# Patient Record
Sex: Female | Born: 1990 | Race: Black or African American | Hispanic: No | Smoking: Current every day smoker
Health system: Southern US, Community
[De-identification: ages and names within clinical notes are randomized; demographics above are authoritative.]

## PROBLEM LIST (undated history)

## (undated) DIAGNOSIS — J40 Bronchitis, not specified as acute or chronic: Secondary | ICD-10-CM

## (undated) HISTORY — PX: BREAST SURGERY: SHX581

---

## 2012-12-28 ENCOUNTER — Encounter (HOSPITAL_COMMUNITY): Payer: Self-pay | Admitting: *Deleted

## 2012-12-28 ENCOUNTER — Emergency Department (HOSPITAL_COMMUNITY)
Admission: EM | Admit: 2012-12-28 | Discharge: 2012-12-28 | Disposition: A | Discharge status: Home / Self Care | Payer: Self-pay | Attending: Emergency Medicine | Admitting: Emergency Medicine

## 2012-12-28 DIAGNOSIS — F17200 Nicotine dependence, unspecified, uncomplicated: Secondary | ICD-10-CM | POA: Insufficient documentation

## 2012-12-28 DIAGNOSIS — L0291 Cutaneous abscess, unspecified: Secondary | ICD-10-CM

## 2012-12-28 DIAGNOSIS — IMO0002 Reserved for concepts with insufficient information to code with codable children: Secondary | ICD-10-CM | POA: Insufficient documentation

## 2012-12-28 MED ORDER — SULFAMETHOXAZOLE-TRIMETHOPRIM 800-160 MG PO TABS: 2.0000 | ORAL_TABLET | Freq: Two times a day (BID) | ORAL | Status: DC

## 2012-12-28 MED ORDER — CEPHALEXIN 500 MG PO CAPS: 500.0000 mg | ORAL_CAPSULE | Freq: Four times a day (QID) | ORAL | Status: DC

## 2012-12-28 NOTE — ED Provider Notes (Signed)
  CSN: [id number]     Arrival date & time 12/28/12  1126 History     First MD Initiated Contact with Patient 12/28/12 1332     Chief Complaint  Patient presents with  . Abscess   (Consider location/radiation/quality/duration/timing/severity/associated sxs/prior Treatment) HPI Comments: Patient presents with a recurrent abscess of the left axilla.  Abscess has been present since yesterday and is gradually worsening.  She reports that she has had an abscess of this same area three different times in the past.  She states that she is supposed to see Surgery, but has not been able to due to lack of insurance.  She denies any history of DM.  Denies fever, chills, nausea, or vomiting.  She has not noticed any drainage from the area at this time.  She has not tried any treatment prior to arrival.  The history is provided by the patient.    History reviewed. No pertinent past medical history. History reviewed. No pertinent past surgical history. No family history on file. History  Substance Use Topics  . Smoking status: Current Every Day Smoker -- 0.25 packs/day    Types: Cigarettes  . Smokeless tobacco: Not on file  . Alcohol Use: No   OB History   Grav Para Term Preterm Abortions TAB SAB Ect Mult Living                 Review of Systems  Constitutional: Negative for fever and chills.  Skin:       abscess  All other systems reviewed and are negative.    Allergies  Review of patient's allergies indicates no known allergies.  Home Medications  No current outpatient prescriptions on file. BP 115/69  Temp(Src) 97.9 F (36.6 C) (Oral)  Resp 18  SpO2 100%  LMP 12/07/2012 Physical Exam  Nursing note and vitals reviewed. Constitutional: She appears well-developed and well-nourished.  HENT:  Head: Normocephalic and atraumatic.  Neck: Normal range of motion. Neck supple.  Cardiovascular: Normal rate, regular rhythm and normal heart sounds.   Pulmonary/Chest: Effort normal and  breath sounds normal.  Neurological: She is alert.  Skin: Skin is warm and dry.  2 cm abscess of the left axilla with surrounding induration.  No surrounding erythema or warmth.  Psychiatric: She has a normal mood and affect.    ED Course   Procedures (including critical care time)  Labs Reviewed - No data to display No results found. No diagnosis found.  INCISION AND DRAINAGE Performed by: Anne Shutter, Jaquell Seddon Consent: Verbal consent obtained. Risks and benefits: risks, benefits and alternatives were discussed Type: abscess  Body area: left axilla  Anesthesia: local infiltration  Incision was made with a scalpel.  Local anesthetic: lidocaine 2% with epinephrine  Anesthetic total: 3 ml  Complexity: complex Blunt dissection to break up loculations  Drainage: purulent  Drainage amount: very small  Patient tolerance: Patient tolerated the procedure well with no immediate complications.     MDM  Patient with recurrent abscess of left axilla.  Small amount of purulent fluid expressed with incision.   Abscess was not large enough to warrant packing or drain,  wound recheck in 2 days. Encouraged home warm compresses.  Skin surrounding abscess indurated.  Will d/c to home with prescriptions for antibiotic.   Pascal Lux [place], PA-C 12/28/12 1536

## 2012-12-28 NOTE — ED Notes (Signed)
Pt with hx of 3 abscesses to L armpit.  States abscess since last night.  No drainage noted at this time.

## 2012-12-28 NOTE — ED Notes (Signed)
Called and looked for pt in general waiting area to take her to fast track

## 2012-12-30 NOTE — ED Provider Notes (Signed)
Medical screening examination/treatment/procedure(s) were performed by non-physician practitioner and as supervising physician I was immediately available for consultation/collaboration.   Laray Anger, DO 12/30/12 2104

## 2013-06-27 ENCOUNTER — Encounter (HOSPITAL_COMMUNITY): Payer: Self-pay | Admitting: Emergency Medicine

## 2013-06-27 ENCOUNTER — Emergency Department (HOSPITAL_COMMUNITY)
Admission: EM | Admit: 2013-06-27 | Discharge: 2013-06-28 | Disposition: A | Discharge status: Home / Self Care | Payer: BC Managed Care – PPO | Attending: Emergency Medicine | Admitting: Emergency Medicine

## 2013-06-27 DIAGNOSIS — F17200 Nicotine dependence, unspecified, uncomplicated: Secondary | ICD-10-CM | POA: Insufficient documentation

## 2013-06-27 DIAGNOSIS — J111 Influenza due to unidentified influenza virus with other respiratory manifestations: Secondary | ICD-10-CM

## 2013-06-27 DIAGNOSIS — M549 Dorsalgia, unspecified: Secondary | ICD-10-CM | POA: Insufficient documentation

## 2013-06-27 MED ORDER — IBUPROFEN 800 MG PO TABS
800.0000 mg | ORAL_TABLET | Freq: Once | ORAL | Status: AC
  Administered 2013-06-27: 800 mg via ORAL
  Filled 2013-06-27: qty 1

## 2013-06-27 MED ORDER — OSELTAMIVIR PHOSPHATE 75 MG PO CAPS: 75.0000 mg | ORAL_CAPSULE | Freq: Two times a day (BID) | ORAL | Status: DC

## 2013-06-27 NOTE — Discharge Instructions (Signed)
You were seen and evaluated for your symptoms of fever, body aches, headache and congestion. At this time your providers feel you have symptoms of the flu. Drink plenty of fluids to stay hydrated. Take Tylenol and ibuprofen for fever and body aches. Followup with a primary care provider for continued evaluation and treatment.    Influenza, Adult Influenza ("the flu") is a viral infection of the respiratory tract. It occurs more often in winter months because people spend more time in close contact with one another. Influenza can make you feel very sick. Influenza easily spreads from person to person (contagious). CAUSES  Influenza is caused by a virus that infects the respiratory tract. You can catch the virus by breathing in droplets from an infected person's cough or sneeze. You can also catch the virus by touching something that was recently contaminated with the virus and then touching your mouth, nose, or eyes. SYMPTOMS  Symptoms typically last 4 to 10 days and may include:  Fever.  Chills.  Headache, body aches, and muscle aches.  Sore throat.  Chest discomfort and cough.  Poor appetite.  Weakness or feeling tired.  Dizziness.  Nausea or vomiting. DIAGNOSIS  Diagnosis of influenza is often made based on your history and a physical exam. A nose or throat swab test can be done to confirm the diagnosis. RISKS AND COMPLICATIONS You may be at risk for a more severe case of influenza if you smoke cigarettes, have diabetes, have chronic heart disease (such as heart failure) or lung disease (such as asthma), or if you have a weakened immune system. Elderly people and pregnant women are also at risk for more serious infections. The most common complication of influenza is a lung infection (pneumonia). Sometimes, this complication can require emergency medical care and may be life-threatening. PREVENTION  An annual influenza vaccination (flu shot) is the best way to avoid getting  influenza. An annual flu shot is now routinely recommended for all adults in the [place] TREATMENT  In mild cases, influenza goes away on its own. Treatment is directed at relieving symptoms. For more severe cases, your caregiver may prescribe antiviral medicines to shorten the sickness. Antibiotic medicines are not effective, because the infection is caused by a virus, not by bacteria. HOME CARE INSTRUCTIONS  Only take over-the-counter or prescription medicines for pain, discomfort, or fever as directed by your caregiver.  Use a cool mist humidifier to make breathing easier.  Get plenty of rest until your temperature returns to normal. This usually takes 3 to 4 days.  Drink enough fluids to keep your urine clear or pale yellow.  Cover your mouth and nose when coughing or sneezing, and wash your hands well to avoid spreading the virus.  Stay home from work or school until your fever has been gone for at least 1 full day. SEEK MEDICAL CARE IF:   You have chest pain or a deep cough that worsens or produces more mucus.  You have nausea, vomiting, or diarrhea. SEEK IMMEDIATE MEDICAL CARE IF:   You have difficulty breathing, shortness of breath, or your skin or nails turn bluish.  You have severe neck pain or stiffness.  You have a severe headache, facial pain, or earache.  You have a worsening or recurring fever.  You have nausea or vomiting that cannot be controlled. MAKE SURE YOU:  Understand these instructions.  Will watch your condition.  Will get help right away if you are not doing well or get worse. Document Released: 04/19/2000  Document Revised: 10/22/2011 Document Reviewed: 07/22/2011 [facility] Patient Information 2014 [place], [place].

## 2013-06-27 NOTE — ED Notes (Signed)
Pt states she is here for back pain and sore throat  Pt states it hurts to swallow  Pt states her sxs started today  Pt is crying in triage

## 2013-06-27 NOTE — ED Provider Notes (Signed)
CSN: [id number]     Arrival date & time 06/27/13  2243 History  This chart was scribed for non-physician practitioner [name], PA working with Lyanne Co, MD by Elveria Rising, ED Scribe. This patient was seen in room WTR8/WTR8 and the patient's care was started at 11:44 PM.   Chief Complaint  Patient presents with  . Back Pain  . Sore Throat      The history is provided by the patient. No language interpreter was used.   HPI Comments: Yvonne Allen is a 23 y.o. female who presents to the Emergency Department complaining of back pain, onset this morning. Patient reports associated neck pain, sore throat, tiredness, headache, rhinorrhea, chills, fever, generalized myalgia, and one episode of vomiting today.  Patient reports that she has been feeling sick, tired and sleeping all day. Denies any associated cough. Patient has not taken medication to manage symptoms. Patient denies having any urinary issues. No recent sick contacts. No recent travel. No other aggravating or alleviating factors. No other associated symptoms.     History reviewed. No pertinent past medical history. History reviewed. No pertinent past surgical history. History reviewed. No pertinent family history. History  Substance Use Topics  . Smoking status: Current Every Day Smoker -- 0.25 packs/day    Types: Cigarettes  . Smokeless tobacco: Not on file  . Alcohol Use: Yes     Comment: occ   OB History   Grav Para Term Preterm Abortions TAB SAB Ect Mult Living                 Review of Systems  Constitutional: Positive for fever and chills.  HENT: Positive for sore throat. Negative for congestion and rhinorrhea.   Respiratory: Negative for cough.   Gastrointestinal: Positive for vomiting. Negative for diarrhea.  Genitourinary: Negative for dysuria and difficulty urinating.  Musculoskeletal: Positive for back pain and myalgias.  All other systems reviewed and are negative.      Allergies  Review of  patient's allergies indicates no known allergies.  Home Medications   No current outpatient prescriptions on file. BP 152/110  Pulse 104  Temp(Src) 100.4 F (38 C) (Oral)  Resp 24  Ht 5\' 5"  (1.651 m)  Wt 180 lb (81.647 kg)  BMI 29.95 kg/m2  SpO2 99%  LMP 06/05/2013 Physical Exam  Nursing note and vitals reviewed. Constitutional: She is oriented to person, place, and time. She appears well-developed and well-nourished. No distress.  HENT:  Head: Normocephalic and atraumatic.  Right Ear: Tympanic membrane normal.  Left Ear: Tympanic membrane normal.  Mouth/Throat: Oropharynx is clear and moist.  Mild redness to pharynx, no swelling or pus.   Eyes: EOM are normal.  Neck: Neck supple. No tracheal deviation present.  Cardiovascular: Normal rate and regular rhythm.   Pulmonary/Chest: Effort normal. No respiratory distress. She has no wheezes. She has no rales.  Abdominal: There is no tenderness. There is no rebound and no guarding.  Musculoskeletal: Normal range of motion.  Lymphadenopathy:    She has no cervical adenopathy.  Neurological: She is alert and oriented to person, place, and time.  Skin: Skin is warm and dry.  Psychiatric: She has a normal mood and affect. Her behavior is normal.    ED Course  Procedures  DIAGNOSTIC STUDIES: Oxygen Saturation is 99% on room air, normal by my interpretation.    COORDINATION OF CARE: 11:44 PM- patient seen and evaluated. She appears well no acute distress. Does not appear severely ill or toxic.  Does have fever and symptoms consistent with influenza. Normal respirations and O2 sats during exam. Lungs are clear. No clinical concerns for pneumonia. Pt advised of plan for treatment and pt agrees.       MDM   Final diagnoses:  Influenza   I personally performed the services described in this documentation, which was scribed in my presence. The recorded information has been reviewed and is accurate.    Angus SellerPeter S Zackrey Dyar,  PA-C 06/28/13 0140

## 2013-06-28 NOTE — ED Provider Notes (Signed)
Medical screening examination/treatment/procedure(s) were performed by non-physician practitioner and as supervising physician I was immediately available for consultation/collaboration.  EKG Interpretation   None         Lyanne CoKevin M Deontre Allsup, MD 06/28/13 [phone]

## 2013-08-03 ENCOUNTER — Emergency Department (HOSPITAL_COMMUNITY)
Admission: EM | Admit: 2013-08-03 | Discharge: 2013-08-04 | Disposition: A | Discharge status: Home / Self Care | Payer: BC Managed Care – PPO | Attending: Emergency Medicine | Admitting: Emergency Medicine

## 2013-08-03 ENCOUNTER — Encounter (HOSPITAL_COMMUNITY): Payer: Self-pay | Admitting: Emergency Medicine

## 2013-08-03 DIAGNOSIS — W57XXXA Bitten or stung by nonvenomous insect and other nonvenomous arthropods, initial encounter: Principal | ICD-10-CM

## 2013-08-03 DIAGNOSIS — Y929 Unspecified place or not applicable: Secondary | ICD-10-CM | POA: Insufficient documentation

## 2013-08-03 DIAGNOSIS — F17200 Nicotine dependence, unspecified, uncomplicated: Secondary | ICD-10-CM | POA: Insufficient documentation

## 2013-08-03 DIAGNOSIS — S40861A Insect bite (nonvenomous) of right upper arm, initial encounter: Secondary | ICD-10-CM

## 2013-08-03 DIAGNOSIS — L089 Local infection of the skin and subcutaneous tissue, unspecified: Secondary | ICD-10-CM | POA: Insufficient documentation

## 2013-08-03 DIAGNOSIS — T148 Other injury of unspecified body region: Principal | ICD-10-CM

## 2013-08-03 DIAGNOSIS — Y9389 Activity, other specified: Secondary | ICD-10-CM | POA: Insufficient documentation

## 2013-08-03 MED ORDER — TRAMADOL HCL 50 MG PO TABS: 50.0000 mg | ORAL_TABLET | Freq: Four times a day (QID) | ORAL | Status: DC | PRN

## 2013-08-03 MED ORDER — IBUPROFEN 400 MG PO TABS
600.0000 mg | ORAL_TABLET | Freq: Once | ORAL | Status: AC
  Administered 2013-08-03: 600 mg via ORAL
  Filled 2013-08-03 (×2): qty 1

## 2013-08-03 MED ORDER — CLINDAMYCIN HCL 150 MG PO CAPS: 300.0000 mg | ORAL_CAPSULE | Freq: Three times a day (TID) | ORAL | Status: DC

## 2013-08-03 MED ORDER — CLINDAMYCIN HCL 150 MG PO CAPS
300.0000 mg | ORAL_CAPSULE | Freq: Once | ORAL | Status: AC
  Administered 2013-08-03: 300 mg via ORAL
  Filled 2013-08-03: qty 2

## 2013-08-03 NOTE — Discharge Instructions (Signed)
Apply warm compresses to affected area 3-4 times a day.  Be sure to take antibiotics as prescribed.  Take tramadol as needed for pain. Do not drive while taking as this can cause drowsiness. Follow up in 2-3 days for recheck by primary care provider or ER if needed.  Return sooner if worsening symptoms including fever, increased pain, swelling, or difficulty bending elbow.

## 2013-08-03 NOTE — ED Provider Notes (Signed)
CSN: [id number]     Arrival date & time 08/03/13  2214 History   First MD Initiated Contact with Patient 08/03/13 2259 This chart was scribed for non-physician practitioner Junius FinnerErin O'Malley, PA-C working with Enid SkeensJoshua M Zavitz, MD by Valera CastleSteven Perry, ED scribe. This patient was seen in room TR09C/TR09C and the patient's care was started at 11:40 PM.     Chief Complaint  Patient presents with  . Insect Bite   (Consider location/radiation/quality/duration/timing/severity/associated sxs/prior Treatment) The history is provided by the patient. No language interpreter was used.   HPI Comments: Yvonne Allen is a 23 y.o. female who presents to the Emergency Department complaining of an insect bite over her right forearm, onset earlier today, with associated constant, burning pain, redness, and swelling. She denies seeing the bug that bit her. She reports moving her right arm exacerbates her pain. She denies any drainage. She denies applying an ointment over her bite and denies taking any pain medication. She denies fever, and any other associated symptoms. She denies allergies to medications.   PCP - No PCP Per Patient  History reviewed. No pertinent past medical history. History reviewed. No pertinent past surgical history. No family history on file. History  Substance Use Topics  . Smoking status: Current Every Day Smoker -- 0.25 packs/day    Types: Cigarettes  . Smokeless tobacco: Not on file  . Alcohol Use: Yes     Comment: occ   OB History   Grav Para Term Preterm Abortions TAB SAB Ect Mult Living                 Review of Systems  Constitutional: Negative for fever.  Skin: Positive for color change (redness) and wound (insect bite to right forearm without drainage).   Allergies  Review of patient's allergies indicates no known allergies.  Home Medications   Current Outpatient Rx  Name  Route  Sig  Dispense  Refill  . clindamycin (CLEOCIN) 150 MG capsule   Oral   Take 2 capsules  (300 mg total) by mouth 3 (three) times daily. May dispense as 150mg  capsules   60 capsule   0   . traMADol (ULTRAM) 50 MG tablet   Oral   Take 1 tablet (50 mg total) by mouth every 6 (six) hours as needed.   15 tablet   0     BP 126/82  Pulse 62  Temp(Src) 98.5 F (36.9 C) (Oral)  Resp 14  Ht 5\' 5"  (1.651 m)  Wt 183 lb (83.008 kg)  BMI 30.45 kg/m2  SpO2 96%  LMP 07/06/2013  Physical Exam  Nursing note and vitals reviewed. Constitutional: She is oriented to person, place, and time. She appears well-developed and well-nourished.  HENT:  Head: Normocephalic and atraumatic.  Eyes: EOM are normal.  Neck: Normal range of motion.  Cardiovascular: Normal rate.   Pulmonary/Chest: Effort normal.  Musculoskeletal: Normal range of motion.  FROM right elbow.  Neurological: She is alert and oriented to person, place, and time.  Skin: Skin is warm and dry. There is erythema.     Right arm-anterior aspect, radial side, near anticubital region.  2x3cm area of erythema and warmth with 1cm centralized area of induration with 2 small puncture wounds. Tenderness to palpation and light touch. No discharge or bleeding.  No red streaking.  Psychiatric: She has a normal mood and affect. Her behavior is normal.   ED Course  Procedures (including critical care time)  DIAGNOSTIC STUDIES: Oxygen Saturation is 96% on  room air, normal by my interpretation.    COORDINATION OF CARE: 11:42 PM-Discussed treatment plan which includes antibiotic and pain medication with pt at bedside and pt agreed to plan.   Labs Review Labs Reviewed - No data to display Imaging Review No results found.   EKG Interpretation None     Medications  clindamycin (CLEOCIN) capsule 300 mg (not administered)  ibuprofen (ADVIL,MOTRIN) tablet 600 mg (not administered)   MDM   Final diagnoses:  Insect bite of arm, right, infected    Pt c/o gradually worsening insect bite, appears to be infected. FROM right  elbow. Not concerned for septic joint.  There is induration w/o fluctuance, no active drainage or bleeding. Pt appears well, non-toxic, afebrile. Do not believe further workup or imaging needed at this time. Will tx for cellulitis, Rx: clindamycin, first dose given in ED, tramadol as needed for pain. Advised to f/u in 2 days if not improving. Return precautions provided. Pt verbalized understanding and agreement with tx plan.   I personally performed the services described in this documentation, which was scribed in my presence. The recorded information has been reviewed and is accurate.   Junius Finner, PA-C 08/03/13 2356

## 2013-08-03 NOTE — ED Notes (Signed)
Pt. reports insect bite at right forearm with progressing reddness / swelling . No drainage or discharge .

## 2013-08-04 NOTE — ED Provider Notes (Signed)
Medical screening examination/treatment/procedure(s) were performed by non-physician practitioner and as supervising physician I was immediately available for consultation/collaboration.   EKG Interpretation None        Enid SkeensJoshua M Pinkie Manger, MD 08/04/13 [phone]

## 2013-10-14 ENCOUNTER — Emergency Department (INDEPENDENT_AMBULATORY_CARE_PROVIDER_SITE_OTHER)
Admission: EM | Admit: 2013-10-14 | Discharge: 2013-10-14 | Disposition: A | Discharge status: Home / Self Care | Payer: BC Managed Care – PPO | Source: Home / Self Care

## 2013-10-14 ENCOUNTER — Encounter (HOSPITAL_COMMUNITY): Payer: Self-pay | Admitting: Emergency Medicine

## 2013-10-14 DIAGNOSIS — J020 Streptococcal pharyngitis: Secondary | ICD-10-CM

## 2013-10-14 LAB — POCT RAPID STREP A: STREPTOCOCCUS, GROUP A SCREEN (DIRECT): POSITIVE — AB

## 2013-10-14 MED ORDER — METHYLPREDNISOLONE SODIUM SUCC 125 MG IJ SOLR
125.0000 mg | Freq: Once | INTRAMUSCULAR | Status: AC
  Administered 2013-10-14: 125 mg via INTRAMUSCULAR

## 2013-10-14 MED ORDER — AMOXICILLIN 500 MG PO TABS: 500.0000 mg | ORAL_TABLET | Freq: Two times a day (BID) | ORAL | Status: DC

## 2013-10-14 MED ORDER — METHYLPREDNISOLONE SODIUM SUCC 125 MG IJ SOLR
INTRAMUSCULAR | Status: AC
  Filled 2013-10-14: qty 2

## 2013-10-14 NOTE — ED Provider Notes (Signed)
CSN: [id number]     Arrival date & time 10/14/13  1623 History   None    Chief Complaint  Patient presents with  . Sore Throat   (Consider location/radiation/quality/duration/timing/severity/associated sxs/prior Treatment) HPI  Sore throat. Started last night. Getting worse. Unable to eat due to pain. theraflu w/o benefit. Denies sick contacts. Hurts to swallow. Pain is described as a rock on throat. Non-radiating. Fever and chills, achy and general malaise. Deneis runny nose, cough, n/v/d, HA.   History reviewed. No pertinent past medical history. History reviewed. No pertinent past surgical history. History reviewed. No pertinent family history. History  Substance Use Topics  . Smoking status: Current Every Day Smoker -- 0.25 packs/day    Types: Cigarettes  . Smokeless tobacco: Not on file  . Alcohol Use: Yes     Comment: occ   OB History   Grav Para Term Preterm Abortions TAB SAB Ect Mult Living                 Review of Systems  Constitutional: Positive for fever, chills, activity change and appetite change.  HENT: Positive for sore throat. Negative for postnasal drip, rhinorrhea, sinus pressure and sneezing.   All other systems reviewed and are negative.   Allergies  Review of patient's allergies indicates no known allergies.  Home Medications   Prior to Admission medications   Medication Sig Start Date End Date Taking? Authorizing Provider  amoxicillin (AMOXIL) 500 MG tablet Take 1 tablet (500 mg total) by mouth 2 (two) times daily. 10/14/13   Ozella Rocks, MD  clindamycin (CLEOCIN) 150 MG capsule Take 2 capsules (300 mg total) by mouth 3 (three) times daily. May dispense as 150mg  capsules 08/03/13   Junius Finner, PA-C  traMADol (ULTRAM) 50 MG tablet Take 1 tablet (50 mg total) by mouth every 6 (six) hours as needed. 08/03/13   Junius Finner, PA-C   BP 122/68  Pulse 98  Temp(Src) 99.7 F (37.6 C) (Oral)  Resp 20  SpO2 100%  LMP 10/13/2013 Physical Exam   Constitutional: She appears well-developed and well-nourished. No distress.  HENT:  Tonsils 2+ bilat w/ copious exudate and injected  Neck: Normal range of motion. Neck supple.  Cardiovascular: Normal rate and normal heart sounds.  Exam reveals no gallop.   No murmur heard. Pulmonary/Chest: Effort normal and breath sounds normal. No stridor. No respiratory distress. She has no wheezes. She has no rales. She exhibits no tenderness.  Abdominal: Soft. She exhibits no distension.  Musculoskeletal: Normal range of motion. She exhibits no tenderness.  Lymphadenopathy:    She has cervical adenopathy.  Neurological: She is alert. She exhibits normal muscle tone.  Skin: No rash noted. She is not diaphoretic.  Psychiatric: She has a normal mood and affect. Her behavior is normal. Judgment and thought content normal.    ED Course  Procedures (including critical care time) Labs Review Labs Reviewed  POCT RAPID STREP A ([facility]RG CARE ONLY) - Abnormal; Notable for the following:    Streptococcus, Group A Screen (Direct) POSITIVE (*)    All other components within normal limits    Imaging Review No results found.   MDM   1. Strep pharyngitis    Strep + pharyngitis. Amox. Solumedrol 125 in office for immediate relief as pt w/ difficulty swallowing. Ibuprofen 600 prn pain and fever.  precuations given and all questions answered Shelly Flatten, MD Family Medicine PGY-3 10/14/2013, 5:34 PM      Ozella Rocks, MD 10/14/13  1734 

## 2013-10-14 NOTE — Discharge Instructions (Signed)
You are suffering from strep throat Start the amoxicillin to kill ithe infection and take this until it is gone The injection given you tonight was a steroid which will help with the swelling and pain Please start ibuprofen 600mg  every 6 hours as needed for pain  Pharyngitis Pharyngitis is a sore throat (pharynx). There is redness, pain, and swelling of your throat. HOME CARE   Drink enough fluids to keep your pee (urine) clear or pale yellow.  Only take medicine as told by your doctor.  You may get sick again if you do not take medicine as told. Finish your medicines, even if you start to feel better.  Do not take aspirin.  Rest.  Rinse your mouth (gargle) with salt water ( tsp of salt per 1 qt of water) every 1 2 hours. This will help the pain.  If you are not at risk for choking, you can suck on hard candy or sore throat lozenges. GET HELP IF:  You have large, tender lumps on your neck.  You have a rash.  You cough up green, yellow-brown, or bloody spit. GET HELP RIGHT AWAY IF:   You have a stiff neck.  You drool or cannot swallow liquids.  You throw up (vomit) or are not able to keep medicine or liquids down.  You have very bad pain that does not go away with medicine.  You have problems breathing (not from a stuffy nose). MAKE SURE YOU:   Understand these instructions.  Will watch your condition.  Will get help right away if you are not doing well or get worse. Document Released: 10/09/2007 Document Revised: 02/10/2013 Document Reviewed: 12/28/2012 [facility] Patient Information 2014 [place], [place].

## 2013-10-14 NOTE — ED Notes (Signed)
C/o sore throat which started last night States her right ear does hurt States it is hard to swallow theraflu was taking as tx

## 2013-10-15 NOTE — ED Provider Notes (Signed)
Medical screening examination/treatment/procedure(s) were performed by a resident physician and as supervising physician I was immediately available for consultation/collaboration.  Leslee Homeavid Khiem Gargis, M.D.  Reuben Likesavid C Canaan Holzer, MD 10/15/13 2156

## 2014-01-27 ENCOUNTER — Other Ambulatory Visit (HOSPITAL_COMMUNITY): Payer: Self-pay | Admitting: Obstetrics and Gynecology

## 2014-01-27 DIAGNOSIS — Z3141 Encounter for fertility testing: Secondary | ICD-10-CM

## 2014-02-07 ENCOUNTER — Encounter (HOSPITAL_COMMUNITY): Payer: Self-pay

## 2014-02-07 ENCOUNTER — Ambulatory Visit (HOSPITAL_COMMUNITY)
Admission: RE | Admit: 2014-02-07 | Discharge: 2014-02-07 | Disposition: A | Discharge status: Home / Self Care | Payer: BC Managed Care – PPO | Source: Ambulatory Visit | Attending: Obstetrics and Gynecology | Admitting: Obstetrics and Gynecology

## 2014-02-07 DIAGNOSIS — Z3141 Encounter for fertility testing: Secondary | ICD-10-CM | POA: Diagnosis not present

## 2014-02-07 MED ORDER — IOHEXOL 300 MG/ML  SOLN
20.0000 mL | Freq: Once | INTRAMUSCULAR | Status: AC | PRN
  Administered 2014-02-07: 20 mL

## 2015-05-17 ENCOUNTER — Encounter (HOSPITAL_COMMUNITY): Payer: Self-pay

## 2015-05-17 ENCOUNTER — Emergency Department (HOSPITAL_COMMUNITY)
Admission: EM | Admit: 2015-05-17 | Discharge: 2015-05-17 | Disposition: A | Discharge status: Home / Self Care | Payer: Self-pay | Attending: Emergency Medicine | Admitting: Emergency Medicine

## 2015-05-17 DIAGNOSIS — F17210 Nicotine dependence, cigarettes, uncomplicated: Secondary | ICD-10-CM | POA: Insufficient documentation

## 2015-05-17 DIAGNOSIS — N611 Abscess of the breast and nipple: Secondary | ICD-10-CM | POA: Insufficient documentation

## 2015-05-17 DIAGNOSIS — Z792 Long term (current) use of antibiotics: Secondary | ICD-10-CM | POA: Insufficient documentation

## 2015-05-17 MED ORDER — LIDOCAINE-EPINEPHRINE (PF) 2 %-1:200000 IJ SOLN
10.0000 mL | Freq: Once | INTRAMUSCULAR | Status: AC
  Administered 2015-05-17: 10 mL
  Filled 2015-05-17: qty 10

## 2015-05-17 MED ORDER — SULFAMETHOXAZOLE-TRIMETHOPRIM 800-160 MG PO TABS: 1.0000 | ORAL_TABLET | Freq: Two times a day (BID) | ORAL | Status: AC

## 2015-05-17 MED ORDER — CEPHALEXIN 500 MG PO CAPS: 500.0000 mg | ORAL_CAPSULE | Freq: Four times a day (QID) | ORAL | Status: DC

## 2015-05-17 MED ORDER — OXYCODONE-ACETAMINOPHEN 5-325 MG PO TABS
1.0000 | ORAL_TABLET | Freq: Once | ORAL | Status: AC
  Administered 2015-05-17: 1 via ORAL
  Filled 2015-05-17: qty 1

## 2015-05-17 MED ORDER — OXYCODONE-ACETAMINOPHEN 5-325 MG PO TABS: 1.0000 | ORAL_TABLET | Freq: Four times a day (QID) | ORAL | Status: DC | PRN

## 2015-05-17 NOTE — ED Provider Notes (Signed)
CSN: [id number]     Arrival date & time 05/17/15  0140 History  By signing my name below, I, Freida Busman, attest that this documentation has been prepared under the direction and in the presence of Shon Baton, MD . Electronically Signed: Freida Busman, Scribe. 05/17/2015. 3:57 AM.      Chief Complaint  Patient presents with  . Breast Problem   The history is provided by the patient. No language interpreter was used.     HPI Comments:  Yvonne Allen is a 25 y.o. female who presents to the Emergency Department complaining of 10/10 right breast pain x 2 days with associated nipple discharge. Pt had a nipple piercing placed several years ago. She removed the piercing "awhile" ago; states there is now a "knot" at the site of the piercing. She denies fever. She also denies h/o similar symptom. Pt has applied warm compress with little relief.  History reviewed. No pertinent past medical history. History reviewed. No pertinent past surgical history. No family history on file. Social History  Substance Use Topics  . Smoking status: Current Every Day Smoker -- 0.25 packs/day    Types: Cigarettes  . Smokeless tobacco: None  . Alcohol Use: Yes     Comment: occ   OB History    No data available     Review of Systems  Constitutional: Negative for fever.  Skin: Positive for wound.       Nipple discharge  All other systems reviewed and are negative.   Allergies  Review of patient's allergies indicates no known allergies.  Home Medications   Prior to Admission medications   Medication Sig Start Date End Date Taking? Authorizing Provider  amoxicillin (AMOXIL) 500 MG tablet Take 1 tablet (500 mg total) by mouth 2 (two) times daily. 10/14/13   Ozella Rocks, MD  cephALEXin (KEFLEX) 500 MG capsule Take 1 capsule (500 mg total) by mouth 4 (four) times daily. 05/17/15   Shon Baton, MD  clindamycin (CLEOCIN) 150 MG capsule Take 2 capsules (300 mg total) by mouth 3 (three) times  daily. May dispense as 150mg  capsules 08/03/13   Junius Finner, PA-C  oxyCODONE-acetaminophen (PERCOCET/ROXICET) 5-325 MG tablet Take 1 tablet by mouth every 6 (six) hours as needed for severe pain. 05/17/15   Shon Baton, MD  sulfamethoxazole-trimethoprim (BACTRIM DS,SEPTRA DS) 800-160 MG tablet Take 1 tablet by mouth 2 (two) times daily. 05/17/15 05/24/15  Shon Baton, MD  traMADol (ULTRAM) 50 MG tablet Take 1 tablet (50 mg total) by mouth every 6 (six) hours as needed. 08/03/13   Junius Finner, PA-C   BP 130/63 mmHg  Pulse 93  Temp(Src) 98.1 F (36.7 C) (Oral)  Resp 16  SpO2 95%  LMP 04/16/2015 Physical Exam  Constitutional: She is oriented to person, place, and time. She appears well-developed and well-nourished. No distress.  HENT:  Head: Normocephalic and atraumatic.  Cardiovascular: Normal rate and regular rhythm.   Pulmonary/Chest: Effort normal. No respiratory distress.    Abdominal: Soft. Bowel sounds are normal.  Neurological: She is alert and oriented to person, place, and time.  Skin: Skin is warm and dry.  Chaperone (scribe) was present for breast exam which was performed with no discomfort or complications.    Psychiatric: She has a normal mood and affect.  Nursing note and vitals reviewed.   ED Course  .Marland KitchenIncision and Drainage Date/Time: 05/17/2015 4:45 AM Performed by: Shon Baton Authorized by: Shon Baton Consent: Verbal consent obtained. Risks  and benefits: risks, benefits and alternatives were discussed Consent given by: patient Type: abscess Body area: trunk Location details: right breast Anesthesia: local infiltration Local anesthetic: lidocaine 2% with epinephrine Anesthetic total: 3 ml Needle gauge: 18 Incision type: single straight Complexity: simple Drainage: purulent Drainage amount: moderate     DIAGNOSTIC STUDIES:  Oxygen Saturation is 95% on RA, adequate by my interpretation.    COORDINATION OF CARE:  3:56 AM  Discussed treatment plan with pt at bedside and pt agreed to plan.  Labs Review Labs Reviewed - No data to display  Imaging Review No results found. I have personally reviewed and evaluated these images and lab results as part of my medical decision-making.   EKG Interpretation None      MDM   Final diagnoses:  Subareolar breast abscess   Patient presents with abscess of the right breast. Abscess appears to be subareolar. Spontaneous drainage noted.  Local anesthesia was used and a 18-gauge needle was used to drain some of the abscess with moderate relief. Patient continued to have spontaneous drainage from both nipple piercing sites that appear to be purulent. She is otherwise nontoxic. We will place on antibiotics and have her continue warm soaks and compresses at home. Follow-up with the breast center versus general surgery. Patient was given strict return precautions including fever, worsening pain, increasing redness or swelling of the site.  After history, exam, and medical workup I feel the patient has been appropriately medically screened and is safe for discharge home. Pertinent diagnoses were discussed with the patient. Patient was given return precautions.  I personally performed the services described in this documentation, which was scribed in my presence. The recorded information has been reviewed and is accurate.    Shon Batonourtney F Horton, MD 05/17/15 [phone]

## 2015-05-17 NOTE — ED Notes (Signed)
MD at bedside. 

## 2015-05-17 NOTE — ED Notes (Addendum)
Pt reports draining from her right nipple, yellow/cream in color. Denies order.    10/10 pain.    Pt states there a knot in her nipple now approx the size of a dime.

## 2015-05-17 NOTE — ED Notes (Addendum)
Pt reports she got the areola to her right breast pierced many years ago but states she has been "picking at it" and has noticed purulent drainage from a bump around it. Irritation started 2 days ago.

## 2015-05-17 NOTE — Discharge Instructions (Signed)
You were seen today today for an abscess of your right breast. You will be given pain medication and antibiotic. Continue warm compresses. If not improved, you need to follow-up with the general surgeon or breast clinic for further evaluation and management.  SEEK MEDICAL CARE IF:   Your symptoms do not improve with the treatment prescribed by your health care provider within 2 days. SEEK IMMEDIATE MEDICAL CARE IF:   Your pain and swelling are getting worse.  You have pain that is not controlled with medicine.  You have a red line extending from the breast toward your armpit.  You have a fever or persistent symptoms for more than 2-3 days.  You have a fever and your symptoms suddenly get worse.   This information is not intended to replace advice given to you by your health care provider. Make sure you discuss any questions you have with your health care provider.   Document Released: 04/22/2005 Document Revised: 04/27/2013 Document Reviewed: 11/20/2012 Elsevier Interactive Patient Education [employer]2016 Elsevier Inc.

## 2016-01-02 ENCOUNTER — Encounter (HOSPITAL_COMMUNITY): Payer: Self-pay

## 2016-01-02 ENCOUNTER — Emergency Department (HOSPITAL_COMMUNITY)
Admission: EM | Admit: 2016-01-02 | Discharge: 2016-01-02 | Disposition: A | Discharge status: Home / Self Care | Payer: Self-pay | Attending: Emergency Medicine | Admitting: Emergency Medicine

## 2016-01-02 DIAGNOSIS — F17210 Nicotine dependence, cigarettes, uncomplicated: Secondary | ICD-10-CM | POA: Insufficient documentation

## 2016-01-02 DIAGNOSIS — Z79899 Other long term (current) drug therapy: Secondary | ICD-10-CM | POA: Insufficient documentation

## 2016-01-02 DIAGNOSIS — N611 Abscess of the breast and nipple: Secondary | ICD-10-CM | POA: Insufficient documentation

## 2016-01-02 MED ORDER — SULFAMETHOXAZOLE-TRIMETHOPRIM 800-160 MG PO TABS: 1.0000 | ORAL_TABLET | Freq: Two times a day (BID) | ORAL | 0 refills | Status: AC

## 2016-01-02 MED ORDER — HYDROCODONE-ACETAMINOPHEN 5-325 MG PO TABS: 1.0000 | ORAL_TABLET | Freq: Four times a day (QID) | ORAL | 0 refills | Status: DC | PRN

## 2016-01-02 MED ORDER — SULFAMETHOXAZOLE-TRIMETHOPRIM 800-160 MG PO TABS
1.0000 | ORAL_TABLET | Freq: Once | ORAL | Status: AC
  Administered 2016-01-02: 1 via ORAL
  Filled 2016-01-02: qty 1

## 2016-01-02 MED ORDER — OXYCODONE-ACETAMINOPHEN 5-325 MG PO TABS
1.0000 | ORAL_TABLET | Freq: Once | ORAL | Status: AC
  Administered 2016-01-02: 1 via ORAL
  Filled 2016-01-02: qty 1

## 2016-01-02 NOTE — ED Triage Notes (Signed)
Pt complaining of irritation and redness to R nipple. Pt states some discharge x 3 days. Denies any fevers or shortness of breath.

## 2016-01-02 NOTE — Discharge Instructions (Signed)
Take ibuprofen for pain. Norco for severe pain. Continue warm compresses. Take Bactrim for infection. Go to breast center today for ultrasound and further treatment.

## 2016-01-02 NOTE — ED Provider Notes (Signed)
[facility]-EMERGENCY DEPT Provider Note   CSN: [id number] Arrival date & time: 01/02/16  0043     History   Chief Complaint Chief Complaint  Patient presents with  . Breast Discharge    HPI Yvonne Allen is a 25 y.o. female.  HPI Yvonne Allen is a 25 y.o. female presents to emergency department with recurrent right breast infection. States she noticed several days ago pain to the right breast, just beneath the nipple. She states since then increased pain and swelling. She reports similar symptoms a year ago and states that her abscess was drained in emergency department. She denies any fever or chills. No discharge. Has not taken any for pain prior to coming in. States palpation of the breast makes it worse, nothing makes it better.   History reviewed. No pertinent past medical history.  There are no active problems to display for this patient.   History reviewed. No pertinent surgical history.  OB History    No data available       Home Medications    Prior to Admission medications   Medication Sig Start Date End Date Taking? Authorizing Provider  amoxicillin (AMOXIL) 500 MG tablet Take 1 tablet (500 mg total) by mouth 2 (two) times daily. 10/14/13   Ozella Rocksavid J Merrell, MD  cephALEXin (KEFLEX) 500 MG capsule Take 1 capsule (500 mg total) by mouth 4 (four) times daily. 05/17/15   Shon Batonourtney F Horton, MD  clindamycin (CLEOCIN) 150 MG capsule Take 2 capsules (300 mg total) by mouth 3 (three) times daily. May dispense as 150mg  capsules 08/03/13   Junius FinnerErin O'Malley, PA-C  HYDROcodone-acetaminophen (NORCO) 5-325 MG tablet Take 1 tablet by mouth every 6 (six) hours as needed for moderate pain. 01/02/16   Osborn Pullin, PA-C  oxyCODONE-acetaminophen (PERCOCET/ROXICET) 5-325 MG tablet Take 1 tablet by mouth every 6 (six) hours as needed for severe pain. 05/17/15   Shon Batonourtney F Horton, MD  sulfamethoxazole-trimethoprim (BACTRIM DS,SEPTRA DS) 800-160 MG tablet Take 1 tablet by mouth 2 (two) times  daily. 01/02/16 01/09/16  Isidoro Santillana, PA-C  traMADol (ULTRAM) 50 MG tablet Take 1 tablet (50 mg total) by mouth every 6 (six) hours as needed. 08/03/13   Junius FinnerErin O'Malley, PA-C    Family History History reviewed. No pertinent family history.  Social History Social History  Substance Use Topics  . Smoking status: Current Every Day Smoker    Packs/day: 0.25    Types: Cigarettes  . Smokeless tobacco: Never Used  . Alcohol use Yes     Comment: occ     Allergies   Review of patient's allergies indicates no known allergies.   Review of Systems Review of Systems  Constitutional: Negative for chills and fever.  Respiratory: Negative for cough, chest tightness and shortness of breath.   Cardiovascular: Positive for chest pain. Negative for palpitations and leg swelling.  Gastrointestinal: Negative for abdominal pain, diarrhea, nausea and vomiting.  Genitourinary: Negative for dysuria, flank pain and pelvic pain.  Musculoskeletal: Negative for arthralgias, myalgias, neck pain and neck stiffness.  Skin: Negative for rash.  Neurological: Negative for dizziness, weakness and headaches.  All other systems reviewed and are negative.    Physical Exam Updated Vital Signs BP 123/79   Pulse 61   Temp 98.1 F (36.7 C) (Oral)   Resp 16   Ht 5\' 5"  (1.651 m)   Wt 99.5 kg   LMP 11/27/2015 (Approximate)   SpO2 96%   BMI 36.52 kg/m   Physical Exam  Constitutional: She appears well-developed  and well-nourished. No distress.  HENT:  Head: Normocephalic.  Eyes: Conjunctivae are normal.  Neck: Neck supple.  Cardiovascular: Normal rate, regular rhythm and normal heart sounds.   Pulmonary/Chest: Effort normal and breath sounds normal. No respiratory distress. She has no wheezes. She has no rales.  Swelling noted to the right areola. Palpable abscess-like structure just beneath the areola. Nipple is normal. Mild erythema surrounding area all. Tender to palpation.  Abdominal: Soft. Bowel  sounds are normal. She exhibits no distension. There is no tenderness. There is no rebound.  Musculoskeletal: She exhibits no edema.  Neurological: She is alert.  Skin: Skin is warm and dry.  Psychiatric: She has a normal mood and affect. Her behavior is normal.  Nursing note and vitals reviewed.    ED Treatments / Results  Labs (all labs ordered are listed, but only abnormal results are displayed) Labs Reviewed - No data to display  EKG  EKG Interpretation None       Radiology No results found.  Procedures Procedures (including critical care time)  Medications Ordered in ED Medications  oxyCODONE-acetaminophen (PERCOCET/ROXICET) 5-325 MG per tablet 1 tablet (not administered)  sulfamethoxazole-trimethoprim (BACTRIM DS,SEPTRA DS) 800-160 MG per tablet 1 tablet (not administered)     Initial Impression / Assessment and Plan / ED Course  I have reviewed the triage vital signs and the nursing notes.  Pertinent labs & imaging results that were available during my care of the patient were reviewed by me and considered in my medical decision making (see chart for details).  Clinical Course    Patient emergency department with recurrent sub-areolar abscess to the right breast. Abscess appears to be deep, Discussed with Dr. Wilkie Aye, not comfortable draining in emergency department. We'll need to get official ultrasound and have it incised and drained by a specialist. Will start of Bactrim, Norco for pain, warm compresses at home. All are deferred to breast center for ultrasound this morning. Patient agrees and voices understanding. She is neurovascular intact. Nontoxic appearing. Normal vital signs.  Vitals:   01/02/16 0058 01/02/16 0528  BP: 123/79 115/74  Pulse: 61 (!) 55  Resp: 16 18  Temp: 98.1 F (36.7 C) 97.8 F (36.6 C)  TempSrc: Oral Oral  SpO2: 96% 100%  Weight: 99.5 kg   Height: 5\' 5"  (1.651 m)      Final Clinical Impressions(s) / ED Diagnoses   Final  diagnoses:  Breast abscess    New Prescriptions New Prescriptions   HYDROCODONE-ACETAMINOPHEN (NORCO) 5-325 MG TABLET    Take 1 tablet by mouth every 6 (six) hours as needed for moderate pain.   SULFAMETHOXAZOLE-TRIMETHOPRIM (BACTRIM DS,SEPTRA DS) 800-160 MG TABLET    Take 1 tablet by mouth 2 (two) times daily.     Jaynie Crumble, PA-C 01/02/16 9147    Shon Baton, MD 01/02/16 [phone]

## 2016-01-04 ENCOUNTER — Other Ambulatory Visit (HOSPITAL_COMMUNITY): Payer: Self-pay | Admitting: *Deleted

## 2016-01-04 ENCOUNTER — Ambulatory Visit (HOSPITAL_COMMUNITY)
Admission: RE | Admit: 2016-01-04 | Discharge: 2016-01-04 | Disposition: A | Discharge status: Home / Self Care | Payer: Self-pay | Source: Ambulatory Visit | Attending: Obstetrics and Gynecology | Admitting: Obstetrics and Gynecology

## 2016-01-04 ENCOUNTER — Ambulatory Visit
Admission: RE | Admit: 2016-01-04 | Discharge: 2016-01-04 | Disposition: A | Discharge status: Home / Self Care | Payer: No Typology Code available for payment source | Source: Ambulatory Visit | Attending: Obstetrics and Gynecology | Admitting: Obstetrics and Gynecology

## 2016-01-04 ENCOUNTER — Ambulatory Visit: Admission: RE | Admit: 2016-01-04 | Payer: No Typology Code available for payment source | Source: Ambulatory Visit

## 2016-01-04 ENCOUNTER — Encounter (HOSPITAL_COMMUNITY): Payer: Self-pay

## 2016-01-04 ENCOUNTER — Ambulatory Visit
Admission: RE | Admit: 2016-01-04 | Discharge: 2016-01-04 | Disposition: A | Discharge status: Home / Self Care | Payer: Self-pay | Source: Ambulatory Visit | Attending: Obstetrics and Gynecology | Admitting: Obstetrics and Gynecology

## 2016-01-04 VITALS — BP 114/70 | Temp 99.1°F | Ht 65.0 in | Wt 219.0 lb

## 2016-01-04 DIAGNOSIS — N6310 Unspecified lump in the right breast, unspecified quadrant: Secondary | ICD-10-CM

## 2016-01-04 DIAGNOSIS — R2232 Localized swelling, mass and lump, left upper limb: Secondary | ICD-10-CM

## 2016-01-04 DIAGNOSIS — R2231 Localized swelling, mass and lump, right upper limb: Secondary | ICD-10-CM

## 2016-01-04 DIAGNOSIS — N611 Abscess of the breast and nipple: Secondary | ICD-10-CM

## 2016-01-04 DIAGNOSIS — Z1239 Encounter for other screening for malignant neoplasm of breast: Secondary | ICD-10-CM

## 2016-01-04 HISTORY — DX: Bronchitis, not specified as acute or chronic: J40

## 2016-01-04 NOTE — Progress Notes (Signed)
Complaints of left breast lump x 1 week that is red and warm to the touch. Patient states that the lump is constantly painful. Patient rates the pain at a 9 out of 10.  Pap Smear:  Pap smear not completed today. [facility] and normal per patient. Per patient has no history of an abnormal Pap smear. No Pap smear results are in EPIC.  Physical exam: Breasts Breasts symmetrical. Red around surrounding nipple. Bilateral axilla's have opened lumps that are draining a thick white discharge that is greater left axilla. No nipple retraction bilateral breasts. No nipple discharge bilateral breasts. No lymphadenopathy. Palpated a lump within the right breast at between 11-12 o'clock on the axilla. Palpated lumps bilateral axilla. Complaints of pain when palpated right breast lump. Patient complained of some tenderness when palpated bilateral axillary lumps on exam. [facility] and bilateral breast ultrasounds. Appointment scheduled for Thursday, January 04, 2016 at 1530.        Pelvic/Bimanual No Pap smear completed today since last Pap smear was in 2016 per patient. Pap smear not indicated per BCCCP guidelines.   Smoking History: Patient has never smoked.  Patient Navigation: Patient education provided. [facility]. Radiologist recommended a surgical consult. [facility] for a surgical consult of all three areas of concern. Appointment scheduled for Friday, January 05, 2016 at 1415. Gave patient appointment and told her to arrive at 1345 [facility] to [facility] needed [facility].

## 2016-01-04 NOTE — Patient Instructions (Addendum)
Explained breast self awareness to NIKEPaige Allen. Patient did not need a Pap smear today due to last Pap smear was in 2016 per patient. Let her know BCCCP will cover Pap smears every 3 years unless has a history of abnormal Pap smears. [facility] for adiagnostic mammogram and bilateral breast ultrasounds. Appointment scheduled for Thursday, January 04, 2016 at 1530. [facility] per recommendation for a surgical consult of all three areas of concern. Appointment scheduled for Friday, January 05, 2016 at 1415. Gave patient appointment and told her to arrive at 1345 [facility] needed [facility]. Patient aware of appointment and will be there. Yvonne Simonsaige Schow verbalized understanding.  Teah Votaw, Kathaleen Maserhristine Poll, RN 9:28 PM

## 2016-01-05 ENCOUNTER — Encounter (HOSPITAL_COMMUNITY): Payer: Self-pay | Admitting: *Deleted

## 2016-06-09 ENCOUNTER — Emergency Department (HOSPITAL_COMMUNITY): Payer: No Typology Code available for payment source

## 2016-06-09 ENCOUNTER — Encounter (HOSPITAL_COMMUNITY): Payer: Self-pay | Admitting: Oncology

## 2016-06-09 ENCOUNTER — Emergency Department (HOSPITAL_COMMUNITY)
Admission: EM | Admit: 2016-06-09 | Discharge: 2016-06-09 | Disposition: A | Discharge status: Home / Self Care | Payer: No Typology Code available for payment source | Attending: Emergency Medicine | Admitting: Emergency Medicine

## 2016-06-09 DIAGNOSIS — R002 Palpitations: Secondary | ICD-10-CM

## 2016-06-09 DIAGNOSIS — Z79899 Other long term (current) drug therapy: Secondary | ICD-10-CM | POA: Insufficient documentation

## 2016-06-09 DIAGNOSIS — F419 Anxiety disorder, unspecified: Secondary | ICD-10-CM | POA: Insufficient documentation

## 2016-06-09 DIAGNOSIS — F17210 Nicotine dependence, cigarettes, uncomplicated: Secondary | ICD-10-CM | POA: Insufficient documentation

## 2016-06-09 LAB — BASIC METABOLIC PANEL
Anion gap: 8 (ref 5–15)
BUN: 8 mg/dL (ref 6–20)
CALCIUM: 9.4 mg/dL (ref 8.9–10.3)
CO2: 25 mmol/L (ref 22–32)
CREATININE: 1.12 mg/dL — AB (ref 0.44–1.00)
Chloride: 103 mmol/L (ref 101–111)
GFR calc Af Amer: >60 mL/min (ref >60)
GLUCOSE: 108 mg/dL — AB (ref 65–99)
POTASSIUM: 3.4 mmol/L — AB (ref 3.5–5.1)
SODIUM: 136 mmol/L (ref 135–145)

## 2016-06-09 LAB — CBC
HCT: 39.6 % (ref 36.0–46.0)
Hemoglobin: 13.2 g/dL (ref 12.0–15.0)
MCH: 28.4 pg (ref 26.0–34.0)
MCHC: 33.3 g/dL (ref 30.0–36.0)
MCV: 85.2 fL (ref 78.0–100.0)
PLATELETS: 346 10*3/uL (ref 150–400)
RBC: 4.65 MIL/uL (ref 3.87–5.11)
RDW: 13.5 % (ref 11.5–15.5)
WBC: 7.6 10*3/uL (ref 4.0–10.5)

## 2016-06-09 LAB — I-STAT TROPONIN, ED
TROPONIN I, POC: 0 ng/mL (ref 0.00–0.08)
TROPONIN I, POC: 0 ng/mL (ref 0.00–0.08)

## 2016-06-09 MED ORDER — SODIUM CHLORIDE 0.9 % IV BOLUS (SEPSIS)
1000.0000 mL | Freq: Once | INTRAVENOUS | Status: AC
  Administered 2016-06-09: 1000 mL via INTRAVENOUS

## 2016-06-09 NOTE — ED Triage Notes (Addendum)
Pt states that she was sitting talking w/ a friend when she developed left sided CP.  Pt c/o shob, dry mouth, tingling in all extremities.  Pt reports never having a panic attack in the past.  Pt reports using cocaine this evening prior to CP.

## 2016-06-09 NOTE — ED Provider Notes (Signed)
[facility] pending delta troponin with plan to discharge home if negative. Results were negative and discussed with patient. She had some questions about what to do next to make sure this doesn't happen again. Provided patient with resources to establish care with a primary care provider and substance abuse counseling. Emphasized the importance of establishing primary care. Patient understood and agreed with discharge plan.    Georgiana ShoreJessica B Mitchell, PA-C 06/09/16 [id number]    Donnetta HutchingBrian Cook, MD 06/10/16 [phone]

## 2016-06-09 NOTE — ED Provider Notes (Signed)
[facility]-EMERGENCY DEPT Provider Note   CSN: [id number] Arrival date & time: 06/09/16  0343    History   Chief Complaint Chief Complaint  Patient presents with  . Anxiety    HPI Yvonne Allen is a 26 y.o. female.  26 year old female with a history of bronchitis presents to the emergency department for evaluation of shortness of breath. She states that symptoms began 10 minutes prior to arrival. Symptoms were acute in onset and have been persistent with associated palpitations. Patient denies any modifying factors of her symptoms. No medications taken prior to arrival. She does report associated dizziness. She states that she used marijuana and cocaine at 8 PM yesterday. She is a tobacco smoker as well. No associated fever, syncope, near syncope or lightheadedness, hemoptysis, nausea, vomiting. No recent surgeries or hospitalizations. No family history of sudden cardiac death. She denies having similar shortness of breath in the past.   The history is provided by the patient. No language interpreter was used.  Anxiety     Past Medical History:  Diagnosis Date  . Bronchitis     There are no active problems to display for this patient.   History reviewed. No pertinent surgical history.  OB History    Gravida Para Term Preterm AB Living   0 0 0 0 0 0   SAB TAB Ectopic Multiple Live Births   0 0 0 0 0       Home Medications    Prior to Admission medications   Medication Sig Start Date End Date Taking? Authorizing Provider  OVER THE COUNTER MEDICATION Take 1 tablet by mouth 2 (two) times daily. Burn HD- weight loss pill   Yes Historical Provider, MD  amoxicillin (AMOXIL) 500 MG tablet Take 1 tablet (500 mg total) by mouth 2 (two) times daily. Patient not taking: Reported on 01/04/2016 10/14/13   Ozella Rocksavid J Merrell, MD  cephALEXin (KEFLEX) 500 MG capsule Take 1 capsule (500 mg total) by mouth 4 (four) times daily. Patient not taking: Reported on 01/04/2016 05/17/15   Shon Batonourtney F  Horton, MD  clindamycin (CLEOCIN) 150 MG capsule Take 2 capsules (300 mg total) by mouth 3 (three) times daily. May dispense as 150mg  capsules Patient not taking: Reported on 01/04/2016 08/03/13   Junius FinnerErin O'Malley, PA-C  HYDROcodone-acetaminophen (NORCO) 5-325 MG tablet Take 1 tablet by mouth every 6 (six) hours as needed for moderate pain. Patient not taking: Reported on 06/09/2016 01/02/16   Jaynie Crumbleatyana Kirichenko, PA-C  oxyCODONE-acetaminophen (PERCOCET/ROXICET) 5-325 MG tablet Take 1 tablet by mouth every 6 (six) hours as needed for severe pain. Patient not taking: Reported on 01/04/2016 05/17/15   Shon Batonourtney F Horton, MD  traMADol (ULTRAM) 50 MG tablet Take 1 tablet (50 mg total) by mouth every 6 (six) hours as needed. Patient not taking: Reported on 01/04/2016 08/03/13   Junius FinnerErin O'Malley, PA-C    Family History No family history on file.  Social History Social History  Substance Use Topics  . Smoking status: Current Every Day Smoker    Packs/day: 0.25    Types: Cigarettes  . Smokeless tobacco: Never Used  . Alcohol use Yes     Comment: occ     Allergies   Patient has no known allergies.   Review of Systems Review of Systems Ten systems reviewed and are negative for acute change, except as noted in the HPI.    Physical Exam Updated Vital Signs BP 108/59 (BP Location: Left Arm)   Pulse (!) 58   Resp  18   LMP 06/03/2016 (Exact Date)   SpO2 100%   Physical Exam  Constitutional: She is oriented to person, place, and time. She appears well-developed and well-nourished. No distress.  Anxious, tearful  HENT:  Head: Normocephalic and atraumatic.  Eyes: Conjunctivae and EOM are normal. No scleral icterus.  Neck: Normal range of motion.  No JVD  Cardiovascular: Normal rate, regular rhythm and intact distal pulses.   Pulmonary/Chest: Effort normal. No respiratory distress. She has no wheezes. She has no rales.  Respirations even and unlabored. Lungs clear to auscultation bilaterally    Musculoskeletal: Normal range of motion.  Neurological: She is alert and oriented to person, place, and time. She exhibits normal muscle tone. Coordination normal.  GCS 15. Patient moving all extremities.  Skin: Skin is warm and dry. No rash noted. She is not diaphoretic. No erythema. No pallor.  Psychiatric: Her behavior is normal. Her mood appears anxious.  Nursing note and vitals reviewed.    ED Treatments / Results  Labs (all labs ordered are listed, but only abnormal results are displayed) Labs Reviewed  BASIC METABOLIC PANEL - Abnormal; Notable for the following:       Result Value   Potassium 3.4 (*)    Glucose, Bld 108 (*)    Creatinine, Ser 1.12 (*)    All other components within normal limits  CBC  I-STAT TROPOININ, ED    EKG  EKG Interpretation  Date/Time:  Sunday June 09 2016 03:53:57 EST Ventricular Rate:  96 PR Interval:    QRS Duration: 95 QT Interval:  375 QTC Calculation: 474 R Axis:   106 Text Interpretation:  Sinus rhythm Borderline right axis deviation borderline T waves No old tracing to compare Confirmed by GOLDSTON MD, SCOTT ([id number]) on 06/09/2016 6:39:16 AM       Radiology Dg Chest 2 View  Result Date: 06/09/2016 CLINICAL DATA:  Left-sided chest pain.  Cocaine use this evening. EXAM: CHEST  2 VIEW COMPARISON:  None. FINDINGS: Lung volumes are low. The cardiomediastinal contours are normal. The lungs are clear. Pulmonary vasculature is normal. No consolidation, pleural effusion, or pneumothorax. No acute osseous abnormalities are seen. IMPRESSION: Hypoventilatory chest without acute abnormality. Electronically Signed   By: Melanie  Ehinger M.D.   On: 06/09/2016 04:41    Procedures Procedures (including critical care time)  Medications Ordered in ED Medications  sodium chloride 0.9 % bolus 1,000 mL (1,000 mLs Intravenous New Bag/Given 06/09/16 0606)     Initial Impression / Assessment and Plan / ED Course  I have reviewed the triage vital  signs and the nursing notes.  Pertinent labs & imaging results that were available during my care of the patient were reviewed by me and considered in my medical decision making (see chart for details).     25  year old female presents to the emergency department for evaluation of palpitations and shortness of breath. She reports using marijuana and cocaine at 8PM yesterday. No family history of sudden cardiac death. No personal risk factors for ACS besides tobacco use. Initial cardiac workup is reassuring. No evidence of acute cardiopulmonary abnormality on chest x-ray.  Patient with delta troponin pending. Anticipate discharge if this is negative. Low suspicion for cardiac etiology. Heart score is 2 c/w low risk of ACS. Also doubt PE given lack of tachypnea, dyspnea, hypoxia, or tachycardia. Patient is PERC negative.   Patient asymptomatic on reassessment. VSS. She has no current complaints; denies pain. Patient signed out to Mathews Robinsons, PA-C at shift  change who will follow up on labs and discharge if pending troponin is negative.   Final Clinical Impressions(s) / ED Diagnoses   Final diagnoses:  Palpitations  Anxiety    New Prescriptions New Prescriptions   No medications on file     Antony Madura, PA-C 06/09/16 1610    Pricilla Loveless, MD 06/09/16 [phone]

## 2016-08-19 ENCOUNTER — Encounter (HOSPITAL_COMMUNITY): Payer: Self-pay

## 2016-08-19 ENCOUNTER — Emergency Department (HOSPITAL_COMMUNITY)
Admission: EM | Admit: 2016-08-19 | Discharge: 2016-08-19 | Disposition: A | Discharge status: Home / Self Care | Payer: No Typology Code available for payment source | Attending: Emergency Medicine | Admitting: Emergency Medicine

## 2016-08-19 DIAGNOSIS — L732 Hidradenitis suppurativa: Secondary | ICD-10-CM

## 2016-08-19 DIAGNOSIS — L02419 Cutaneous abscess of limb, unspecified: Secondary | ICD-10-CM

## 2016-08-19 DIAGNOSIS — L02412 Cutaneous abscess of left axilla: Secondary | ICD-10-CM | POA: Insufficient documentation

## 2016-08-19 DIAGNOSIS — F17210 Nicotine dependence, cigarettes, uncomplicated: Secondary | ICD-10-CM | POA: Insufficient documentation

## 2016-08-19 MED ORDER — LIDOCAINE HCL (PF) 1 % IJ SOLN
5.0000 mL | Freq: Once | INTRAMUSCULAR | Status: AC
  Administered 2016-08-19: 5 mL
  Filled 2016-08-19: qty 5

## 2016-08-19 MED ORDER — OXYCODONE-ACETAMINOPHEN 5-325 MG PO TABS
1.0000 | ORAL_TABLET | Freq: Once | ORAL | Status: AC
  Administered 2016-08-19: 1 via ORAL
  Filled 2016-08-19: qty 1

## 2016-08-19 MED ORDER — OXYCODONE-ACETAMINOPHEN 5-325 MG PO TABS: 1.0000 | ORAL_TABLET | Freq: Three times a day (TID) | ORAL | 0 refills | Status: AC | PRN

## 2016-08-19 NOTE — ED Notes (Signed)
ED Provider at bedside. 

## 2016-08-19 NOTE — ED Triage Notes (Signed)
Pt states she has a cyst under her left arm. She reports hx of same. Having to have it lanced. Pt denies diabetes.

## 2016-08-19 NOTE — ED Provider Notes (Signed)
[facility]-EMERGENCY DEPT Provider Note    By signing my name below, I, Earmon Phoenix, attest that this documentation has been prepared under the direction and in the presence of Felicie Morn, FNP. Electronically Signed: Earmon Phoenix, ED Scribe. 08/19/16. 9:51 PM.    History   Chief Complaint Chief Complaint  Patient presents with  . Cyst   The history is provided by the patient and medical records. No language interpreter was used.    Yvonne Allen is an obese 26 y.o. female with PMHx of hidradenitis suppurativa who presents to the Emergency Department complaining of an abscess to the left axilla that appeared about one week ago. She reports associated pain. She has not taken anything for pain. Touching the area and moving her LUE increases the pain. She denies alleviating factors. She denies fever, chills, nausea, vomiting. She reports having incision and drainage of abscesses in the past. She denies h/o DM.    Past Medical History:  Diagnosis Date  . Bronchitis     There are no active problems to display for this patient.   History reviewed. No pertinent surgical history.  OB History    Gravida Para Term Preterm AB Living   0 0 0 0 0 0   SAB TAB Ectopic Multiple Live Births   0 0 0 0 0       Home Medications    Prior to Admission medications   Medication Sig Start Date End Date Taking? Authorizing Provider  OVER THE COUNTER MEDICATION Take 1 tablet by mouth 2 (two) times daily. Burn HD- weight loss pill    Historical Provider, MD    Family History History reviewed. No pertinent family history.  Social History Social History  Substance Use Topics  . Smoking status: Current Every Day Smoker    Packs/day: 0.25    Types: Cigarettes  . Smokeless tobacco: Never Used  . Alcohol use Yes     Comment: occ     Allergies   Patient has no known allergies.   Review of Systems Review of Systems  Constitutional: Negative for chills and fever.  Gastrointestinal:  Negative for nausea and vomiting.  Skin: Positive for color change (abscess left axilla).  All other systems reviewed and are negative.    Physical Exam Updated Vital Signs BP (!) 137/92 (BP Location: Right Arm)   Pulse 60   Temp 98.4 F (36.9 C) (Oral)   Resp 16   LMP 07/19/2016 (Within Weeks)   SpO2 96%   Physical Exam  Constitutional: She is oriented to person, place, and time. She appears well-developed and well-nourished.  HENT:  Head: Normocephalic and atraumatic.  Eyes: Conjunctivae are normal.  Neck: Normal range of motion.  Cardiovascular: Normal rate.   Pulmonary/Chest: Effort normal.  Abdominal: Soft. She exhibits no distension.  Musculoskeletal: Normal range of motion.  Neurological: She is alert and oriented to person, place, and time.  Skin: Skin is warm and dry.  5 x 2 cm area of fluctuance with tenderness to palpation.  Psychiatric: She has a normal mood and affect. Her behavior is normal.  Nursing note and vitals reviewed.    ED Treatments / Results  DIAGNOSTIC STUDIES: Oxygen Saturation is 96% on RA, adequate by my interpretation.   COORDINATION OF CARE: 8:54 PM- Will have pt change into gown to better examine her. Pt verbalizes understanding and agrees to plan.  9:01 PM- Will incise and drain abscess.  Medications  lidocaine (PF) (XYLOCAINE) 1 % injection 5 mL (5 mLs  Infiltration Given by Other 08/19/16 2118)  oxyCODONE-acetaminophen (PERCOCET/ROXICET) 5-325 MG per tablet 1 tablet (1 tablet Oral Given 08/19/16 2126)    Labs (all labs ordered are listed, but only abnormal results are displayed) Labs Reviewed - No data to display  EKG  EKG Interpretation None       Radiology No results found.  Procedures .Marland KitchenIncision and Drainage Date/Time: 08/19/2016 9:31 PM Performed by: Katrinka Blazing, Aasir Daigler Authorized by: Katrinka Blazing, Gazella Anglin   Consent:    Consent obtained:  Verbal   Consent given by:  Patient Location:    Type:  Abscess   Size:  5 x 2 cm    Location:  Upper extremity   Upper extremity location:  Arm   Arm location: left axilla. Pre-procedure details:    Skin preparation:  Betadine Anesthesia (see MAR for exact dosages):    Anesthesia method:  Local infiltration   Local anesthetic:  Lidocaine 1% w/o epi Procedure type:    Complexity:  Simple Procedure details:    Needle aspiration: no     Incision types:  Stab incision   Incision depth:  Subcutaneous   Scalpel blade:  11   Wound management:  Probed and deloculated   Drainage:  Purulent   Drainage amount:  Moderate   Wound treatment:  Wound left open   Packing materials:  None Post-procedure details:    Patient tolerance of procedure:  Tolerated well, no immediate complications     (including critical care time)  Medications Ordered in ED Medications  lidocaine (PF) (XYLOCAINE) 1 % injection 5 mL (5 mLs Infiltration Given by Other 08/19/16 2118)  oxyCODONE-acetaminophen (PERCOCET/ROXICET) 5-325 MG per tablet 1 tablet (1 tablet Oral Given 08/19/16 2126)     Initial Impression / Assessment and Plan / ED Course  I have reviewed the triage vital signs and the nursing notes.  Pertinent labs & imaging results that were available during my care of the patient were reviewed by me and considered in my medical decision making (see chart for details).     Patient with skin abscess. Incision and drainage performed in the ED today.  Abscess was not large enough to warrant packing or drain placement. Wound recheck in 2 days. Supportive care and return precautions discussed.  The patient appears reasonably screened and/or stabilized for discharge and I doubt any other emergent medical condition requiring further screening, evaluation, or treatment in the ED prior to discharge.   Final Clinical Impressions(s) / ED Diagnoses   Final diagnoses:  Hidradenitis suppurativa of left axilla  Axillary abscess    New Prescriptions Discharge Medication List as of 08/19/2016 10:04 PM     START taking these medications   Details  oxyCODONE-acetaminophen (PERCOCET/ROXICET) 5-325 MG tablet Take 1 tablet by mouth every 8 (eight) hours as needed for severe pain., Starting Mon 08/19/2016, Print        I personally performed the services described in this documentation, which was scribed in my presence. The recorded information has been reviewed and is accurate.     Felicie Morn, NP 08/19/16 2130    Marily Memos, MD 08/20/16 8657

## 2017-06-30 ENCOUNTER — Other Ambulatory Visit: Payer: Self-pay | Admitting: Obstetrics and Gynecology

## 2017-06-30 DIAGNOSIS — N611 Abscess of the breast and nipple: Secondary | ICD-10-CM

## 2017-07-03 ENCOUNTER — Other Ambulatory Visit (HOSPITAL_COMMUNITY): Payer: Self-pay | Admitting: *Deleted

## 2017-07-03 ENCOUNTER — Ambulatory Visit
Admission: RE | Admit: 2017-07-03 | Discharge: 2017-07-03 | Disposition: A | Discharge status: Home / Self Care | Payer: No Typology Code available for payment source | Source: Ambulatory Visit | Attending: Obstetrics and Gynecology | Admitting: Obstetrics and Gynecology

## 2017-07-03 ENCOUNTER — Encounter (HOSPITAL_COMMUNITY): Payer: Self-pay

## 2017-07-03 ENCOUNTER — Other Ambulatory Visit: Payer: Self-pay | Admitting: Obstetrics and Gynecology

## 2017-07-03 ENCOUNTER — Ambulatory Visit (HOSPITAL_COMMUNITY)
Admission: RE | Admit: 2017-07-03 | Discharge: 2017-07-03 | Disposition: A | Discharge status: Home / Self Care | Payer: Self-pay | Source: Ambulatory Visit | Attending: Obstetrics and Gynecology | Admitting: Obstetrics and Gynecology

## 2017-07-03 VITALS — BP 120/66 | Ht 65.0 in | Wt 191.8 lb

## 2017-07-03 DIAGNOSIS — N611 Abscess of the breast and nipple: Secondary | ICD-10-CM

## 2017-07-03 DIAGNOSIS — Z1239 Encounter for other screening for malignant neoplasm of breast: Secondary | ICD-10-CM

## 2017-07-03 DIAGNOSIS — R2232 Localized swelling, mass and lump, left upper limb: Secondary | ICD-10-CM

## 2017-07-03 DIAGNOSIS — N6310 Unspecified lump in the right breast, unspecified quadrant: Secondary | ICD-10-CM

## 2017-07-03 NOTE — Patient Instructions (Addendum)
Explained breast self awareness to Yvonne Allen. Patient did not need a Pap smear today due to last Pap smear was 386-months ago per patient. Let her know BCCCP will cover Pap smears every 3 years unless has a history of abnormal Pap smears. [facility]. Appointment scheduled for Thursday, July 03, 2017 at 1110. Discussed smoking cessation with patient. Referred to the [facility] and gave resources to the free smoking cessation classes at [facility]. Yvonne Allen verbalized understanding.  Yvonne Allen, Yvonne Maserhristine Poll, RN 12:24 PM

## 2017-07-03 NOTE — Progress Notes (Signed)
Complaints of right breast lump x 2 weeks that has increased in size. Patient stated the lump is red and painful. Patient states the pain is constant. Patient rates the pain at a 10 out of 10.  Pap Smear: Pap smear not completed today. [facility] and normal per patient. Per patient has no history of an abnormal Pap smear. No Pap smear results are in EPIC.  Physical exam: Breasts Breasts symmetrical. Red area around surrounding the right nipple. Left axilla has opened lumps that are draining a thick white pus like discharge. No nipple retraction bilateral breasts. No nipple discharge bilateral breasts. No lymphadenopathy. Palpated a lump within the right breast at 12 o'clock on the areola. Palpated a lump within the left axilla. Complaints of pain when palpated both lumps. [facility] for bilateral breast ultras[facility]. Appointment scheduled for Thursday, July 03, 2017 at 1110.    Pelvic/Bimanual No Pap smear completed today since last Pap smear was 476-months ago per patient. Pap smear not indicated per BCCCP guidelines  Smoking History: Patient is a current smoker. Discussed smoking cessation with patient. Referred to the [facility] Q[facility] and gave resources to the free smoking cessation classes at [facility].  Patient Navigation: Patient education provided. Access to services provided for patient through BCCCP program.   Breast and Cervical Cancer Risk Assessment: Patient has no family history of breast cancer, known genetic mutations, or radiation treatment to the chest before age 28. Patient has no history of cervical dysplasia, immunocompromised, or DES exposure in-utero.

## 2017-07-04 ENCOUNTER — Encounter (HOSPITAL_COMMUNITY): Payer: Self-pay | Admitting: *Deleted

## 2018-09-03 ENCOUNTER — Other Ambulatory Visit: Payer: Self-pay

## 2018-09-03 ENCOUNTER — Encounter (HOSPITAL_COMMUNITY): Payer: Self-pay

## 2018-09-03 ENCOUNTER — Emergency Department (HOSPITAL_COMMUNITY)
Admission: EM | Admit: 2018-09-03 | Discharge: 2018-09-03 | Disposition: A | Discharge status: Home / Self Care | Payer: BLUE CROSS/BLUE SHIELD | Attending: Emergency Medicine | Admitting: Emergency Medicine

## 2018-09-03 DIAGNOSIS — L02412 Cutaneous abscess of left axilla: Secondary | ICD-10-CM | POA: Diagnosis not present

## 2018-09-03 DIAGNOSIS — F17210 Nicotine dependence, cigarettes, uncomplicated: Secondary | ICD-10-CM | POA: Diagnosis not present

## 2018-09-03 MED ORDER — DOXYCYCLINE HYCLATE 100 MG PO CAPS: 100.0000 mg | ORAL_CAPSULE | Freq: Two times a day (BID) | ORAL | 0 refills | Status: AC

## 2018-09-03 MED ORDER — LIDOCAINE-EPINEPHRINE (PF) 2 %-1:200000 IJ SOLN
10.0000 mL | Freq: Once | INTRAMUSCULAR | Status: AC
  Administered 2018-09-03: 10 mL
  Filled 2018-09-03: qty 20

## 2018-09-03 MED ORDER — HYDROCODONE-ACETAMINOPHEN 5-325 MG PO TABS
2.0000 | ORAL_TABLET | Freq: Once | ORAL | Status: AC
  Administered 2018-09-03: 2 via ORAL
  Filled 2018-09-03: qty 2

## 2018-09-03 NOTE — ED Provider Notes (Signed)
[facility] EMERGENCY DEPARTMENT Provider Note   CSN: [id number] Arrival date & time: 09/03/18  2217    History   Chief Complaint Chief Complaint  Patient presents with  . Cyst    HPI Yvonne Allen is a 28 y.o. female.     Patient presents to the emergency department with a chief complaint of left axillary abscess.  She states that she has a known cyst, which frequently becomes inflamed and infected.  She reports that she is scheduled to have surgery on it later this month at [facility].  She states that she has to have it drained repeatedly.  She reports worsening pain and swelling over the past week, but denies any discharge.  She denies any fevers or chills.  She states the pain is severe.  She denies any other associated symptoms.  The history is provided by the patient. No language interpreter was used.    Past Medical History:  Diagnosis Date  . Bronchitis     There are no active problems to display for this patient.   History reviewed. No pertinent surgical history.   OB History    Gravida  0   Para  0   Term  0   Preterm  0   AB  0   Living  0     SAB  0   TAB  0   Ectopic  0   Multiple  0   Live Births  0            Home Medications    Prior to Admission medications   Medication Sig Start Date End Date Taking? Authorizing Provider  OVER THE COUNTER MEDICATION Take 1 tablet by mouth 2 (two) times daily. Burn HD- weight loss pill    [provider]  oxyCODONE-acetaminophen (PERCOCET/ROXICET) 5-325 MG tablet Take 1 tablet by mouth every 8 (eight) hours as needed for severe pain. Patient not taking: Reported on 07/03/2017 08/19/16   Felicie Morn, NP    Family History History reviewed. No pertinent family history.  Social History Social History   Tobacco Use  . Smoking status: Current Every Day Smoker    Packs/day: 0.25    Types: Cigarettes  . Smokeless tobacco: Never Used  Substance Use Topics  .  Alcohol use: Yes    Comment: occ  . Drug use: No     Allergies   Patient has no known allergies.   Review of Systems Review of Systems  All other systems reviewed and are negative.    Physical Exam Updated Vital Signs BP (!) 128/96 (BP Location: Right Arm)   Pulse 82   Temp 99.2 F (37.3 C) (Oral)   Resp 15   Ht 5\' 5"  (1.651 m)   Wt 90.7 kg   SpO2 100%   BMI 33.28 kg/m   Physical Exam Vitals signs and nursing note reviewed.  Constitutional:      General: She is not in acute distress.    Appearance: She is well-developed.  HENT:     Head: Normocephalic and atraumatic.  Eyes:     Conjunctiva/sclera: Conjunctivae normal.  Neck:     Musculoskeletal: Neck supple.  Cardiovascular:     Rate and Rhythm: Normal rate and regular rhythm.     Heart sounds: No murmur.  Pulmonary:     Effort: Pulmonary effort is normal. No respiratory distress.     Breath sounds: Normal breath sounds.  Abdominal:     Palpations: Abdomen is  soft.     Tenderness: There is no abdominal tenderness.  Skin:    General: Skin is warm and dry.     Comments: 2 x 3 cm left axillary cyst/abscess, with the posterior portion appearing acutely inflamed and erythematous, no discharge, no surrounding cellulitis  Neurological:     General: No focal deficit present.     Mental Status: She is alert and oriented to person, place, and time.  Psychiatric:        Mood and Affect: Mood normal.        Behavior: Behavior normal.        Thought Content: Thought content normal.        Judgment: Judgment normal.      ED Treatments / Results  Labs (all labs ordered are listed, but only abnormal results are displayed) Labs Reviewed - No data to display  EKG None  Radiology No results found.  Procedures Procedures (including critical care time) INCISION AND DRAINAGE Performed by: Roxy Horsemanobert Jalin Erpelding Consent: Verbal consent obtained. Risks and benefits: risks, benefits and alternatives were discussed  Type: abscess  Body area: left axilla  Anesthesia: local infiltration  Incision was made with a scalpel.  Local anesthetic: lidocaine 1% with epinephrine  Anesthetic total: 3 ml  Complexity: complex Blunt dissection to break up loculations  Drainage: purulent  Drainage amount: moderate  Packing material: 1/4 in iodoform gauze  Patient tolerance: Patient tolerated the procedure well with no immediate complications.    Medications Ordered in ED Medications  HYDROcodone-acetaminophen (NORCO/VICODIN) 5-325 MG per tablet 2 tablet (has no administration in time range)  lidocaine-EPINEPHrine (XYLOCAINE W/EPI) 2 %-1:200000 (PF) injection 10 mL (has no administration in time range)     Initial Impression / Assessment and Plan / ED Course  I have reviewed the triage vital signs and the nursing notes.  Pertinent labs & imaging results that were available during my care of the patient were reviewed by me and considered in my medical decision making (see chart for details).        Patient with left axillary abscess.  No surrounding cellulitis.  Will incise and drain in the emergency department.  Anticipate discharge with doxycycline.  Final Clinical Impressions(s) / ED Diagnoses   Final diagnoses:  Abscess of axilla, left    ED Discharge Orders         Ordered    doxycycline (VIBRAMYCIN) 100 MG capsule  2 times daily     09/03/18 2346           Roxy HorsemanBrowning, Anokhi Shannon, PA-C 09/03/18 2347    Vanetta MuldersZackowski, Scott, MD 09/08/18 [phone]

## 2018-09-03 NOTE — ED Triage Notes (Signed)
Pt from home w/ a c/o a cyst in her left axilla. It has been there for 4-5 years. She is scheduled to have it removed 09/23/2018. The pain has has increased for the past three days.

## 2018-10-29 ENCOUNTER — Other Ambulatory Visit: Payer: Self-pay

## 2018-10-29 ENCOUNTER — Encounter (HOSPITAL_COMMUNITY): Payer: Self-pay | Admitting: Emergency Medicine

## 2018-10-29 ENCOUNTER — Emergency Department (HOSPITAL_COMMUNITY)
Admission: EM | Admit: 2018-10-29 | Discharge: 2018-10-29 | Disposition: A | Discharge status: Home / Self Care | Payer: BC Managed Care – PPO | Attending: Emergency Medicine | Admitting: Emergency Medicine

## 2018-10-29 DIAGNOSIS — F17210 Nicotine dependence, cigarettes, uncomplicated: Secondary | ICD-10-CM | POA: Insufficient documentation

## 2018-10-29 DIAGNOSIS — J029 Acute pharyngitis, unspecified: Secondary | ICD-10-CM | POA: Insufficient documentation

## 2018-10-29 DIAGNOSIS — J358 Other chronic diseases of tonsils and adenoids: Secondary | ICD-10-CM | POA: Insufficient documentation

## 2018-10-29 LAB — GROUP A STREP BY PCR: Group A Strep by PCR: NOT DETECTED

## 2018-10-29 LAB — POC URINE PREG, ED: Preg Test, Ur: NEGATIVE

## 2018-10-29 MED ORDER — IBUPROFEN 400 MG PO TABS
600.0000 mg | ORAL_TABLET | Freq: Once | ORAL | Status: AC
  Administered 2018-10-29: 600 mg via ORAL
  Filled 2018-10-29: qty 1

## 2018-10-29 MED ORDER — IBUPROFEN 600 MG PO TABS: 600.0000 mg | ORAL_TABLET | Freq: Four times a day (QID) | ORAL | 0 refills | Status: AC | PRN

## 2018-10-29 MED ORDER — DEXAMETHASONE SODIUM PHOSPHATE 10 MG/ML IJ SOLN
10.0000 mg | Freq: Once | INTRAMUSCULAR | Status: AC
  Administered 2018-10-29: 10 mg via INTRAMUSCULAR
  Filled 2018-10-29: qty 1

## 2018-10-29 MED ORDER — CLINDAMYCIN HCL 150 MG PO CAPS
450.0000 mg | ORAL_CAPSULE | Freq: Once | ORAL | Status: AC
Start: 2018-10-29 — End: 2018-10-29
  Administered 2018-10-29: 450 mg via ORAL
  Filled 2018-10-29: qty 3

## 2018-10-29 MED ORDER — CLINDAMYCIN HCL 150 MG PO CAPS: 450.0000 mg | ORAL_CAPSULE | Freq: Three times a day (TID) | ORAL | 0 refills | Status: AC

## 2018-10-29 NOTE — ED Notes (Signed)
Patient verbalizes understanding of discharge instructions. Opportunity for questioning and answers were provided. Armband removed by staff, pt discharged from ED.  

## 2018-10-29 NOTE — ED Provider Notes (Signed)
[facility] Note   CSN: [id number] Arrival date & time: 10/29/18  0802     History   Chief Complaint Chief Complaint  Patient presents with  . Sore Throat    HPI Yvonne Allen is a 28 y.o. female.     HPI  Patient is a 28 year old female with no significant past medical history presenting for sore throat.  Patient reports that it developed suddenly this morning.  She reports that she feels that her throat is swollen and it hurts to swallow but denies any obstructive swallowing or difficulty breathing.  She denies fever or chills.  She denies congestion, rhinorrhea, cough, shortness of breath, nausea, vomiting.  Patient denies any exposure to COVID-19, she is not working currently, and reports that her only travel symptoms to the grocery store.   Past Medical History:  Diagnosis Date  . Bronchitis     There are no active problems to display for this patient.   History reviewed. No pertinent surgical history.   OB History    Gravida  0   Para  0   Term  0   Preterm  0   AB  0   Living  0     SAB  0   TAB  0   Ectopic  0   Multiple  0   Live Births  0            Home Medications    Prior to Admission medications   Medication Sig Start Date End Date Taking? Authorizing Provider  doxycycline (VIBRAMYCIN) 100 MG capsule Take 1 capsule (100 mg total) by mouth 2 (two) times daily. 09/03/18   Roxy HorsemanBrowning, Robert, PA-C  OVER THE COUNTER MEDICATION Take 1 tablet by mouth 2 (two) times daily. Burn HD- weight loss pill    [provider]  oxyCODONE-acetaminophen (PERCOCET/ROXICET) 5-325 MG tablet Take 1 tablet by mouth every 8 (eight) hours as needed for severe pain. Patient not taking: Reported on 07/03/2017 08/19/16   Felicie MornSmith, David, NP    Family History History reviewed. No pertinent family history.  Social History Social History   Tobacco Use  . Smoking status: Current Every Day Smoker   Packs/day: 0.25    Types: Cigarettes  . Smokeless tobacco: Never Used  Substance Use Topics  . Alcohol use: Yes    Comment: occ  . Drug use: No     Allergies   Patient has no known allergies.   Review of Systems Review of Systems  Constitutional: Negative for chills and fever.  HENT: Positive for sore throat. Negative for ear pain, trouble swallowing and voice change.   Respiratory: Negative for stridor.   Gastrointestinal: Negative for nausea and vomiting.     Physical Exam Updated Vital Signs BP 131/89 (BP Location: Right Arm)   Pulse 83   Temp 99.3 F (37.4 C)   Resp 18   SpO2 100%   Physical Exam Vitals signs and nursing note reviewed.  Constitutional:      General: She is not in acute distress.    Appearance: She is well-developed. She is not diaphoretic.     Comments: Sitting comfortably in bed.  HENT:     Head: Normocephalic and atraumatic.     Mouth/Throat:     Mouth: Mucous membranes are moist.     Tonsils: 3+ on the right. 2+ on the left.     Comments: Normal phonation. No muffled voice sounds. Patient swallows secretions without difficulty.  Dentition normal. No lesions of tongue or buccal mucosa. Uvula midline.  Right tonsil is greater than left and appears mildly edematous.  Mild erythema of posterior pharynx. Tonsillar exudate present. No lingual swelling. No induration inferior to tongue. No submandibular tenderness, swelling, or induration.  Tissues of the neck supple. Anterior cervical lymphadenopathy. Right TM without erythema or effusion; left TM without erythema or effusion.  Eyes:     General:        Right eye: No discharge.        Left eye: No discharge.     Conjunctiva/sclera: Conjunctivae normal.     Comments: EOMs normal to gross examination.  Neck:     Musculoskeletal: Normal range of motion.  Cardiovascular:     Rate and Rhythm: Normal rate and regular rhythm.     Heart sounds: Normal heart sounds.  Pulmonary:     Effort: Pulmonary  effort is normal.     Breath sounds: Normal breath sounds. No wheezing or rales.  Abdominal:     General: There is no distension.  Musculoskeletal: Normal range of motion.  Skin:    General: Skin is warm and dry.  Neurological:     Mental Status: She is alert.     Comments: Cranial nerves intact to gross observation. Patient moves extremities without difficulty.  Psychiatric:        Behavior: Behavior normal.        Thought Content: Thought content normal.        Judgment: Judgment normal.      ED Treatments / Results  Labs (all labs ordered are listed, but only abnormal results are displayed) Labs Reviewed  GROUP A STREP BY PCR  POC URINE PREG, ED    EKG    Radiology No results found.  Procedures Procedures (including critical care time)  Medications Ordered in ED Medications  dexamethasone (DECADRON) injection 10 mg (10 mg Intramuscular Given 10/29/18 0933)     Initial Impression / Assessment and Plan / ED Course  I have reviewed the triage vital signs and the nursing notes.  Pertinent labs & imaging results that were available during my care of the patient were reviewed by me and considered in my medical decision making (see chart for details).        This is a well-appearing 28 year old female no significant past medical history presenting for sore throat.  Uvula is midline, tonsillar pillars are nonedematous however she does have right greater than left tonsillar swelling raise a concern for early peritonsillar abscess.  Clinically, she does not have any airway concerns or concerning physical exam findings for deeper space infection of the head or neck.  She is tolerating secretions and p.o. fluids in the emergency department.  Will place on clindamycin and have patient follow-up with ear nose and throat.  Case discussed with Dr. Melene Planan Floyd who is in agreement with plan of care. She was given Decadron here in the emergency department.  Return precautions given  for any increasing pain, difficulty swallowing, difficulty breathing, or significant change in voice quality.  Patient is in understanding and agrees with the plan of care.  Final Clinical Impressions(s) / ED Diagnoses   Final diagnoses:  Sore throat  Asymmetric tonsils    ED Discharge Orders         Ordered    clindamycin (CLEOCIN) 150 MG capsule  3 times daily     10/29/18 0959    ibuprofen (ADVIL) 600 MG tablet  Every 6 hours PRN  10/29/18 [place], [place], PA-C 10/29/18 [place], Dan, DO 10/29/18 1357

## 2018-10-29 NOTE — ED Notes (Signed)
Pt given water. Tolerating well at this time.  

## 2018-10-29 NOTE — Discharge Instructions (Signed)
Please read and follow all provided instructions.  Your diagnoses today include:  1. Sore throat   2. Asymmetric tonsils     Tests performed today include: Strep test: was negative for strep throat Strep culture: you will be notified if this comes back positive Vital signs. See below for your results today.   Medications prescribed:   Please take all of your antibiotics until finished.   You may develop abdominal discomfort or nausea from the antibiotic. If this occurs, you may take it with food. Some patients also get diarrhea with antibiotics. You may help offset this with probiotics which you can buy or get in yogurt. Do not eat or take the probiotics until 2 hours after your antibiotic. Some women develop vaginal yeast infections after antibiotics. If you develop unusual vaginal discharge after being on this medication, please see your primary care provider.   Some people develop allergies to antibiotics. Symptoms of antibiotic allergy can be mild and include a flat rash and itching. They can also be more serious and include:  ?Hives - Hives are raised, red patches of skin that are usually very itchy.  ?Lip or tongue swelling  ?Trouble swallowing or breathing  ?Blistering of the skin or mouth.  If you have any of these serious symptoms, please seek emergency medical care immediately.  You are prescribed ibuprofen, a non-steroidal anti-inflammatory agent (NSAID) for pain. You may take 600 mg every 6 hours as needed for pain. If still requiring this medication around the clock for acute pain after 10 days, please see your primary healthcare provider.  Women who are pregnant, breastfeeding, or planning on becoming pregnant should not take non-steroidal anti-inflammatories such as Advil and Aleve. Tylenol is a safe over the counter pain reliever in pregnant women.  You may combine this medication with Tylenol, 650 mg every 6 hours, so you are receiving something for pain every 3  hours.  This is not a long-term medication unless under the care and direction of your primary provider. Taking this medication long-term and not under the supervision of a healthcare provider could increase the risk of stomach ulcers, kidney problems, and cardiovascular problems such as high blood pressure.    Home care instructions:  Please read the educational materials provided and follow any instructions contained in this packet.  Follow-up instructions: Please call Dr. Constance Holster to make a follow up appointment.   Return instructions:  Please return to the Emergency Department if you experience worsening symptoms.  Return if you have worsening problems swallowing, your neck becomes swollen, you cannot swallow your saliva or your voice becomes muffled.  Return with high persistent fever, persistent vomiting, or if you have trouble breathing.  Please return if you have any other emergent concerns.  Additional Information:  Your vital signs today were: BP 131/89 (BP Location: Right Arm)    Pulse 83    Temp 99.3 F (37.4 C)    Resp 18    SpO2 100%  If your blood pressure (BP) was elevated above 135/85 this visit, please have this repeated by your doctor within one month. --------------

## 2018-10-29 NOTE — ED Triage Notes (Signed)
Pt arrives to ED from home with complaints of sore throat x2 days.

## 2019-12-02 ENCOUNTER — Encounter (HOSPITAL_COMMUNITY): Payer: Self-pay | Admitting: Emergency Medicine

## 2019-12-02 ENCOUNTER — Emergency Department (HOSPITAL_COMMUNITY)
Admission: EM | Admit: 2019-12-02 | Discharge: 2019-12-03 | Disposition: A | Discharge status: Home / Self Care | Payer: BC Managed Care – PPO | Attending: Emergency Medicine | Admitting: Emergency Medicine

## 2019-12-02 ENCOUNTER — Other Ambulatory Visit: Payer: Self-pay

## 2019-12-02 DIAGNOSIS — M549 Dorsalgia, unspecified: Secondary | ICD-10-CM | POA: Diagnosis not present

## 2019-12-02 DIAGNOSIS — Z5321 Procedure and treatment not carried out due to patient leaving prior to being seen by health care provider: Secondary | ICD-10-CM | POA: Insufficient documentation

## 2019-12-02 DIAGNOSIS — R109 Unspecified abdominal pain: Secondary | ICD-10-CM | POA: Diagnosis present

## 2019-12-02 DIAGNOSIS — R1110 Vomiting, unspecified: Secondary | ICD-10-CM | POA: Diagnosis not present

## 2019-12-02 DIAGNOSIS — M542 Cervicalgia: Secondary | ICD-10-CM | POA: Diagnosis not present

## 2019-12-02 LAB — COMPREHENSIVE METABOLIC PANEL
ALT: 13 U/L (ref 0–44)
AST: 18 U/L (ref 15–41)
Albumin: 3.6 g/dL (ref 3.5–5.0)
Alkaline Phosphatase: 42 U/L (ref 38–126)
Anion gap: 8 (ref 5–15)
BUN: 6 mg/dL (ref 6–20)
CO2: 24 mmol/L (ref 22–32)
Calcium: 9 mg/dL (ref 8.9–10.3)
Chloride: 106 mmol/L (ref 98–111)
Creatinine, Ser: 0.85 mg/dL (ref 0.44–1.00)
GFR calc Af Amer: >60 mL/min (ref >60)
GFR calc non Af Amer: >60 mL/min (ref >60)
Glucose, Bld: 95 mg/dL (ref 70–99)
Potassium: 3.7 mmol/L (ref 3.5–5.1)
Sodium: 138 mmol/L (ref 135–145)
Total Bilirubin: 0.6 mg/dL (ref 0.3–1.2)
Total Protein: 6.9 g/dL (ref 6.5–8.1)

## 2019-12-02 LAB — I-STAT BETA HCG BLOOD, ED (MC, WL, AP ONLY): I-stat hCG, quantitative: >2000 m[IU]/mL — ABNORMAL HIGH (ref <5)

## 2019-12-02 LAB — URINALYSIS, ROUTINE W REFLEX MICROSCOPIC
Bilirubin Urine: NEGATIVE
Glucose, UA: NEGATIVE mg/dL
Ketones, ur: NEGATIVE mg/dL
Nitrite: NEGATIVE
Protein, ur: 30 mg/dL — AB
Specific Gravity, Urine: 1.027 (ref 1.005–1.030)
pH: 6 (ref 5.0–8.0)

## 2019-12-02 LAB — CBC
HCT: 38.8 % (ref 36.0–46.0)
Hemoglobin: 12.3 g/dL (ref 12.0–15.0)
MCH: 28.7 pg (ref 26.0–34.0)
MCHC: 31.7 g/dL (ref 30.0–36.0)
MCV: 90.4 fL (ref 80.0–100.0)
Platelets: 326 10*3/uL (ref 150–400)
RBC: 4.29 MIL/uL (ref 3.87–5.11)
RDW: 13.5 % (ref 11.5–15.5)
WBC: 7 10*3/uL (ref 4.0–10.5)
nRBC: 0 % (ref 0.0–0.2)

## 2019-12-02 LAB — LIPASE, BLOOD: Lipase: 31 U/L (ref 11–51)

## 2019-12-02 MED ORDER — SODIUM CHLORIDE 0.9% FLUSH: 3.0000 mL | Freq: Once | INTRAVENOUS | Status: DC

## 2019-12-02 NOTE — ED Triage Notes (Signed)
Patient reports left abdominal pain with emesis today , denies fever or diarrhea , patient stated abortion procedure last 11/23/19 .

## 2019-12-03 ENCOUNTER — Inpatient Hospital Stay (EMERGENCY_DEPARTMENT_HOSPITAL)
Admission: AD | Admit: 2019-12-03 | Discharge: 2019-12-04 | Disposition: A | Discharge status: Home / Self Care | Payer: BC Managed Care – PPO | Source: Home / Self Care | Attending: Emergency Medicine | Admitting: Emergency Medicine

## 2019-12-03 ENCOUNTER — Other Ambulatory Visit: Payer: Self-pay

## 2019-12-03 ENCOUNTER — Emergency Department (HOSPITAL_COMMUNITY): Payer: BC Managed Care – PPO

## 2019-12-03 ENCOUNTER — Encounter (HOSPITAL_COMMUNITY): Payer: Self-pay | Admitting: Emergency Medicine

## 2019-12-03 DIAGNOSIS — O021 Missed abortion: Secondary | ICD-10-CM

## 2019-12-03 DIAGNOSIS — O039 Complete or unspecified spontaneous abortion without complication: Secondary | ICD-10-CM

## 2019-12-03 DIAGNOSIS — Z3A00 Weeks of gestation of pregnancy not specified: Secondary | ICD-10-CM | POA: Insufficient documentation

## 2019-12-03 DIAGNOSIS — M549 Dorsalgia, unspecified: Secondary | ICD-10-CM | POA: Diagnosis not present

## 2019-12-03 DIAGNOSIS — M542 Cervicalgia: Secondary | ICD-10-CM

## 2019-12-03 DIAGNOSIS — F17210 Nicotine dependence, cigarettes, uncomplicated: Secondary | ICD-10-CM | POA: Insufficient documentation

## 2019-12-03 DIAGNOSIS — M546 Pain in thoracic spine: Secondary | ICD-10-CM | POA: Insufficient documentation

## 2019-12-03 DIAGNOSIS — Z791 Long term (current) use of non-steroidal anti-inflammatories (NSAID): Secondary | ICD-10-CM | POA: Insufficient documentation

## 2019-12-03 DIAGNOSIS — O034 Incomplete spontaneous abortion without complication: Secondary | ICD-10-CM

## 2019-12-03 DIAGNOSIS — Z79899 Other long term (current) drug therapy: Secondary | ICD-10-CM | POA: Insufficient documentation

## 2019-12-03 MED ORDER — METHOCARBAMOL 500 MG PO TABS
500.0000 mg | ORAL_TABLET | Freq: Once | ORAL | Status: AC
  Administered 2019-12-03: 500 mg via ORAL
  Filled 2019-12-03: qty 1

## 2019-12-03 NOTE — ED Notes (Signed)
Pt returned from ultrasound

## 2019-12-03 NOTE — Discharge Instructions (Signed)
s Incomplete Miscarriage YOU mAY HAVE A SMALL AMOUNT OF REMAINING TISSUE IN THE UTERUS, BUT IT IS VERY TINY AND SHOULD BE EXPELLED EASILY IN THE NEXT FEW DAYS. A miscarriage is the loss of an unborn baby (fetus) before the 20th week of pregnancy. In an incomplete miscarriage, parts of the fetus or placenta (afterbirth) remain in the body. Most miscarriages happen in the first 3 months of pregnancy. Sometimes, it happens before a woman even knows she is pregnant. Having a miscarriage can be an emotional experience. If you have had a miscarriage, talk with your health care provider about any questions you may have about miscarrying, the grieving process, and your future pregnancy plans. What are the causes? This condition may be caused by:  Problems with the genes or chromosomes that make it impossible for the baby to develop normally. These problems are most often the result of random errors that occur early in development, and are not passed from parent to child (not inherited).  Infection of the cervix or uterus.  Conditions that affect hormone balance in the body.  Problems with the cervix, such as the cervix opening and thinning before pregnancy is at term (cervical insufficiency).  Problems with the uterus, such as a uterus with an abnormal shape, fibroids in the uterus, or problems that were present from birth (congenital abnormalities).  Certain medical conditions.  Smoking, drinking alcohol, or using drugs.  Injury (trauma). Many times, the cause of a miscarriage is not known. What are the signs or symptoms? Symptoms of this condition include:  Vaginal bleeding or spotting, with or without cramps or pain.  Pain or cramping in the abdomen or lower back.  Passing fluid, tissue, or blood clots from the vagina. How is this diagnosed? This condition may be diagnosed based on:  A physical exam.  Ultrasound.  Blood tests.  Urine tests. How is this treated? An incomplete  miscarriage may be treated with:  Dilation and curettage (D&C). This is a procedure in which the cervix is stretched open and the lining of the uterus (endometrium) is scraped to remove any remaining tissue from the pregnancy.  Medicines, such as: ? Antibiotic medicine to treat infection. ? Medicine to help any remaining tissue pass out of your uterus. ? Medicine to reduce (contract) the size of the uterus. These medicines may be given if you have a lot of bleeding. If you have Rh negative blood and your baby was Rh positive, you will need a shot of medicine called Rh immunoglobulinto protect future babies from Rh blood problems. "Rh-negative" and "Rh-positive" refer to whether or not the blood has a specific protein found on the surface of red blood cells (Rh factor). Follow these instructions at home: Medicines   Take over-the-counter and prescription medicines only as told by your health care provider.  If you were prescribed antibiotic medicine, take your antibiotic as told by your health care provider. Do not stop taking the antibiotic even if you start to feel better.  Do not take NSAIDs, such as aspirin and ibuprofen, unless approved by your doctor. These medicines can cause bleeding. Activity  Rest as directed. Ask your health care provider what activities are safe for you.  Have someone help with home and family responsibilities during this time. General instructions  Keep track of the number of sanitary pads you use each day and how soaked (saturated) they are. Write down this information.  Monitor the amount of tissue or blood clots that you pass from your  vagina. Save any large amounts of tissue for your health care provider to examine.  Do not use tampons, douche, or have sex until your health care provider approves.  To help you and your partner with the process of grieving, talk with your health care provider or seek counseling to help cope with the pregnancy  loss.  When you are ready, meet with your health care provider to discuss important steps you should take for your health, as well as steps to take in order to have a healthy pregnancy in the future.  Keep all follow-up visits as told by your health care provider. This is important. Where to find more information  The American Congress of Obstetricians and Gynecologists: www.acog.org  [place] Department of Health and Cytogeneticist of Women's Health: http://hoffman.com/ Contact a health care provider if:  You have a fever or chills.  You have a foul smelling vaginal discharge. Get help right away if:  You have severe cramps or pain in your back or abdomen.  You pass walnut-sized (or larger) blood clots or tissue from your vagina.  You have heavy bleeding, soaking more than 1 regular sanitary pad in an hour.  You become lightheaded or weak.  You pass out.  You have feelings of sadness that take over your thoughts, or you have thoughts of hurting yourself. Summary  In an incomplete miscarriage, parts of the fetus or placenta (afterbirth) remain in the body.  There are multiple treatment options for an incomplete miscarriage, talk to your health care provider about the best option for you.  Follow your health care provider's instructions for follow-up care.  To help you and your partner with the process of grieving, talk with your health care provider or seek counseling to help cope with the pregnancy loss. This information is not intended to replace advice given to you by your health care provider. Make sure you discuss any questions you have with your health care provider. Document Revised: 05/29/2017 Document Reviewed: 05/29/2016 Elsevier Patient Education  2020 [employer].

## 2019-12-03 NOTE — ED Triage Notes (Signed)
Pt. Was here last night and blood was drawn and a urine specimen.

## 2019-12-03 NOTE — ED Notes (Signed)
PT has been called more then 3x and there was no answer. PT moved to OFT

## 2019-12-03 NOTE — MAU Note (Signed)
Pt reports to MAU from [facility] pt was intially seen for neck, back stiffness, and abdominal pain that is a burning like sensation. Pt reports some light bleeding.

## 2019-12-03 NOTE — ED Provider Notes (Signed)
[facility] EMERGENCY DEPARTMENT Provider Note   CSN: [id number] Arrival date & time: 12/03/19  1030   History Chief Complaint  Patient presents with  . Abdominal Pain  . Back Pain  . Neck Pain    Yvonne Allen is a 29 y.o.[id number] female who presents with neck pain, back pain, and abdominal cramping. [facility]. She took pills and had bleeding and cramping afterwards. She still has a little bit of spotting and cramping and is unsure if that is normal or not. She is concerned she may have retained products of conception. She came to the ED last night and had blood work done. She's been having N/V intermittently. She states she also started to get posterior neck and upper back pain about a week ago. She is taking Ibuprofen regularly for the pain which temporarily helps but the pain won't go away. She denies sleeping awkwardly or any trauma. She saw a provider at her school who thought she may have a blood clot in her neck. She denies any chest pain, SOB, leg swelling. She denies fever or chills.   HPI     Past Medical History:  Diagnosis Date  . Bronchitis     There are no problems to display for this patient.   History reviewed. No pertinent surgical history.   OB History    Gravida  0   Para  0   Term  0   Preterm  0   AB  0   Living  0     SAB  0   TAB  0   Ectopic  0   Multiple  0   Live Births  0           No family history on file.  Social History   Tobacco Use  . Smoking status: Current Every Day Smoker    Packs/day: 0.25    Types: Cigarettes  . Smokeless tobacco: Never Used  Vaping Use  . Vaping Use: Never used  Substance Use Topics  . Alcohol use: Yes    Comment: occ  . Drug use: No    Home Medications Prior to Admission medications   Medication Sig Start Date End Date Taking? Authorizing Provider  doxycycline (VIBRAMYCIN) 100 MG capsule Take 1 capsule  (100 mg total) by mouth 2 (two) times daily. 09/03/18   Roxy Horseman, PA-C  OVER THE COUNTER MEDICATION Take 1 tablet by mouth 2 (two) times daily. Burn HD- weight loss pill    [provider]  oxyCODONE-acetaminophen (PERCOCET/ROXICET) 5-325 MG tablet Take 1 tablet by mouth every 8 (eight) hours as needed for severe pain. Patient not taking: Reported on 07/03/2017 08/19/16   Felicie Morn, NP    Allergies    Patient has no known allergies.  Review of Systems   Review of Systems  Constitutional: Negative for chills and fever.  Respiratory: Negative for shortness of breath.   Cardiovascular: Negative for chest pain.  Gastrointestinal: Positive for nausea and vomiting. Negative for abdominal pain and diarrhea.  Genitourinary: Positive for pelvic pain and vaginal bleeding (spotting). Negative for dysuria and vaginal discharge.  Musculoskeletal: Positive for back pain, myalgias and neck pain.  All other systems reviewed and are negative.   Physical Exam Updated Vital Signs BP (!) 134/90   Pulse 75   Temp 98.9 F (37.2 C) (Oral)   Resp 16   LMP 09/24/2019   SpO2 96%  Physical Exam Vitals and nursing note reviewed.  Constitutional:      General: She is not in acute distress.    Appearance: Normal appearance. She is well-developed. She is not ill-appearing.  HENT:     Head: Normocephalic and atraumatic.  Eyes:     General: No scleral icterus.       Right eye: No discharge.        Left eye: No discharge.     Conjunctiva/sclera: Conjunctivae normal.     Pupils: Pupils are equal, round, and reactive to light.  Neck:     Comments: Pain with ROM. Tenderness over the C-spine. 5/5 strength in upper and lower extremities. Diffuse pain across the trapezius bilaterally Cardiovascular:     Rate and Rhythm: Normal rate and regular rhythm.  Pulmonary:     Effort: Pulmonary effort is normal. No respiratory distress.     Breath sounds: Normal breath sounds.  Abdominal:      General: There is no distension.     Palpations: Abdomen is soft.     Tenderness: There is no abdominal tenderness.  Musculoskeletal:     Cervical back: Normal range of motion.     Comments: Pain in the T-spine. Pain across the entire upper back.   No lumbar tenderness  Skin:    General: Skin is warm and dry.  Neurological:     Mental Status: She is alert and oriented to person, place, and time.  Psychiatric:        Behavior: Behavior normal.     ED Results / Procedures / Treatments   Labs (all labs ordered are listed, but only abnormal results are displayed) Labs Reviewed - No data to display  EKG None  Radiology DG Chest 2 View  Result Date: 12/03/2019 CLINICAL DATA:  Neck and upper back pain. EXAM: CHEST - 2 VIEW COMPARISON:  06/09/2016 FINDINGS: The cardiomediastinal contours are normal. The lungs are clear. Pulmonary vasculature is normal. No consolidation, pleural effusion, or pneumothorax. No acute osseous abnormalities are seen. IMPRESSION: Negative radiographs of the chest. Electronically Signed   By: Narda Rutherford M.D.   On: 12/03/2019 20:38   DG Cervical Spine Complete  Result Date: 12/03/2019 CLINICAL DATA:  Cervical neck pain. EXAM: CERVICAL SPINE - COMPLETE 4+ VIEW COMPARISON:  None. FINDINGS: Cervical spine alignment is maintained. Vertebral body heights and intervertebral disc spaces are preserved. The dens is intact. Minor endplate spurring at C4-C5. Posterior elements appear well-aligned. There is no evidence of fracture. No prevertebral soft tissue edema. IMPRESSION: Minor endplate spurring at C4-C5. Otherwise negative cervical spine radiographs. Electronically Signed   By: Narda Rutherford M.D.   On: 12/03/2019 20:36   DG Thoracic Spine 2 View  Result Date: 12/03/2019 CLINICAL DATA:  Upper back pain. EXAM: THORACIC SPINE 2 VIEWS COMPARISON:  None. FINDINGS: The alignment is maintained. Vertebral body heights are maintained. No evidence of fracture. Minor  endplate spurring with preservation of disc spaces. Posterior elements appear intact. There is no paravertebral soft tissue abnormality. IMPRESSION: No acute findings. Minor endplate spurring. Electronically Signed   By: Narda Rutherford M.D.   On: 12/03/2019 20:37   US OB Comp Less 14 Wks  Result Date: 12/03/2019 CLINICAL DATA:  29 year old pregnant female with missed abortion. Patient took pill on 11/23/2019. EXAM: OBSTETRIC <14 WK [place] AND TRANSVAGINAL OB US TECHNIQUE: Both transabdominal and transvaginal ultrasound examinations were performed for complete evaluation of the gestation as well as the maternal uterus, adnexal regions, and pelvic cul-de-sac. Transvaginal technique  was performed to assess early pregnancy. COMPARISON:  None. FINDINGS: The uterus is anteverted appears unremarkable. The endometrium measures 1 cm in thickness. No intrauterine pregnancy identified. Findings may represent recent abortion given the provided history. An early IUP, or an occult ectopic pregnancy are other less likely considerations. There is a 9 x 8 mm heterogeneous and echogenic solid content in the upper endometrium with internal vascularity concerning for retained product of conception. The ovaries are unremarkable. There is trace free fluid in the pelvis. IMPRESSION: 1. No intrauterine pregnancy identified. Findings represent pregnancy of unknown location and differential diagnosis includes: Recent abortion, early IUP, or an occult ectopic pregnancy. However, findings likely represent recent abortion given the clinical history. 2. Echogenic content in the upper endometrium is concerning for retained product of conception. Clinical correlation and follow-up with serial HCG levels and repeat ultrasound as clinically indicated recommended. Electronically Signed   By: Elgie CollardArash  Radparvar M.D.   On: 12/03/2019 21:46   US OB Transvaginal  Result Date: 12/03/2019 CLINICAL DATA:  29 year old pregnant female with missed  abortion. Patient took pill on 11/23/2019. EXAM: OBSTETRIC <14 WK US AND TRANSVAGINAL OB US TECHNIQUE: Both transabdominal and transvaginal ultrasound examinations were performed for complete evaluation of the gestation as well as the maternal uterus, adnexal regions, and pelvic cul-de-sac. Transvaginal technique was performed to assess early pregnancy. COMPARISON:  None. FINDINGS: The uterus is anteverted appears unremarkable. The endometrium measures 1 cm in thickness. No intrauterine pregnancy identified. Findings may represent recent abortion given the provided history. An early IUP, or an occult ectopic pregnancy are other less likely considerations. There is a 9 x 8 mm heterogeneous and echogenic solid content in the upper endometrium with internal vascularity concerning for retained product of conception. The ovaries are unremarkable. There is trace free fluid in the pelvis. IMPRESSION: 1. No intrauterine pregnancy identified. Findings represent pregnancy of unknown location and differential diagnosis includes: Recent abortion, early IUP, or an occult ectopic pregnancy. However, findings likely represent recent abortion given the clinical history. 2. Echogenic content in the upper endometrium is concerning for retained product of conception. Clinical correlation and follow-up with serial HCG levels and repeat ultrasound as clinically indicated recommended. Electronically Signed   By: Elgie CollardArash  Radparvar M.D.   On: 12/03/2019 21:46    Procedures Procedures (including critical care time)  Medications Ordered in ED Medications  methocarbamol (ROBAXIN) tablet 500 mg (has no administration in time range)    ED Course  I have reviewed the triage vital signs and the nursing notes.  Pertinent labs & imaging results that were available during my care of the patient were reviewed by me and considered in my medical decision making (see chart for details).  29 year old female presents with neck and upper  back pain for one week as well as ongoing pelvic cramping after an elective abortion on 7/20. Her vitals are normal. She is well appearing. She has reproducible tenderness of the neck and upper back on exam. Abdomen is soft and non-tender. She had labs done yesterday which were normal but hcg was still reading >2000. Will obtain xrays and pelvic US.  Xrays of the neck, upper back, chest are all normal. I think her neck and upper back pain is MSK in nature but unclear how it's related to her recent abortion. Pelvic US shows possible retained products of conception. Discussed with Dr. Emelda FearFerguson with OBGYN who recommends transfer to [facility]. Pt is agreeable.  MDM Rules/Calculators/A&P  Final Clinical Impression(s) / ED Diagnoses Final diagnoses:  Neck pain  Upper back pain  Retained products of conception following abortion    Rx / DC Orders ED Discharge Orders    None       Bethel Born, PA-C 12/03/19 2214    Geoffery Lyons, MD 12/03/19 2216

## 2019-12-03 NOTE — ED Triage Notes (Signed)
Pt. Stated, Ive had neck and back pain since last Sat.  I had an abortion on July 20 and something just doesn't feel right. My stomach hurts all the time. So maybe they let something in there or what.

## 2019-12-03 NOTE — ED Notes (Signed)
Report given to French Ana, RN at [facility].   Transport called to transport pt via wheelchair

## 2019-12-03 NOTE — ED Notes (Signed)
Transport called to transport pt to [facility]

## 2019-12-03 NOTE — [facility] Provider Note (Signed)
History     CSN: [id number]  Arrival date and time: 12/03/19 1030   None     Chief Complaint  Patient presents with  . Abdominal Pain  . Back Pain  . Neck Pain   HPI back ache and neck ache as well as bleeding per vagina , s/p Voluntary interruption of pregnancy , medical termination. She has passed 3 clumps of tissue in last 2 days.  Pertinent Gynecological History: Menses: flow is moderate Bleeding: s/p El AB Contraception: none, pregnant by "wrong dude, would be trouble" DES exposure: unknown Blood transfusions: none Sexually transmitted diseases: no past history Previous GYN Procedures: RDU  Last mammogram:  Date:  Last pap:  Date:    Past Medical History:  Diagnosis Date  . Bronchitis     Past Surgical History:  Procedure Laterality Date  . BREAST SURGERY      History reviewed. No pertinent family history.  Social History   Tobacco Use  . Smoking status: Current Every Day Smoker    Packs/day: 0.25    Types: Cigarettes  . Smokeless tobacco: Never Used  Vaping Use  . Vaping Use: Never used  Substance Use Topics  . Alcohol use: Yes    Comment: occ  . Drug use: No    Allergies: No Known Allergies  Medications Prior to Admission  Medication Sig Dispense Refill Last Dose  . ibuprofen (ADVIL) 800 MG tablet Take 800 mg by mouth every 8 (eight) hours as needed.   12/02/2019 at Unknown time  . doxycycline (VIBRAMYCIN) 100 MG capsule Take 1 capsule (100 mg total) by mouth 2 (two) times daily. 20 capsule 0   . OVER THE COUNTER MEDICATION Take 1 tablet by mouth 2 (two) times daily. Burn HD- weight loss pill     . oxyCODONE-acetaminophen (PERCOCET/ROXICET) 5-325 MG tablet Take 1 tablet by mouth every 8 (eight) hours as needed for severe pain. 10 tablet 0     Review of Systems Physical Exam   Blood pressure (!) 132/78, pulse 68, temperature 98.3 F (36.8 C), temperature source Oral, resp. rate 16, last menstrual period 09/24/2019, SpO2 98 %.  Physical  Exam Constitutional:      Appearance: She is well-developed. She is not ill-appearing.  HENT:     Head: Normocephalic.  Eyes:     Extraocular Movements: Extraocular movements intact.  Abdominal:     General: Abdomen is flat.     Tenderness: There is no abdominal tenderness.  Neurological:     Mental Status: She is alert.    CLINICAL DATA:  29 year old pregnant female with missed abortion. Patient took pill on 11/23/2019.  EXAM: OBSTETRIC <14 WK [place] AND TRANSVAGINAL OB US  TECHNIQUE: Both transabdominal and transvaginal ultrasound examinations were performed for complete evaluation of the gestation as well as the maternal uterus, adnexal regions, and pelvic cul-de-sac. Transvaginal technique was performed to assess early pregnancy.  COMPARISON:  None.  FINDINGS: The uterus is anteverted appears unremarkable.  The endometrium measures 1 cm in thickness. No intrauterine pregnancy identified. Findings may represent recent abortion given the provided history. An early IUP, or an occult ectopic pregnancy are other less likely considerations.  There is a 9 x 8 mm heterogeneous and echogenic solid content in the upper endometrium with internal vascularity concerning for retained product of conception.  The ovaries are unremarkable.  There is trace free fluid in the pelvis.  IMPRESSION: 1. No intrauterine pregnancy identified. Findings represent pregnancy of unknown location and differential diagnosis includes: Recent abortion,  early IUP, or an occult ectopic pregnancy. However, findings likely represent recent abortion given the clinical history. 2. Echogenic content in the upper endometrium is concerning for retained product of conception. Clinical correlation and follow-up with serial HCG levels and repeat ultrasound as clinically indicated recommended.   Electronically Signed   By: Elgie Collard M.D.   On: 12/03/2019 21:46  [facility] Course   Procedures  MDM Review of labs and u/s with patient  Assessment and Plan  Completed termination of pregnancy. Anticipate steady decline of HCG levels.  Pt to test urine hcg in 10 days expect negative test. Avoid unprotected intercourse til after next spontaneous menstrual flow Continue PNVits. F/u 10 days prn concern.   Tilda Burrow 12/03/2019, 11:43 PM

## 2020-11-25 IMAGING — US US OB TRANSVAGINAL
1 series · 13 of 28 positions shown · non-contrast
Comparison: None.

CLINICAL DATA: 29-year-old pregnant female with missed abortion.
Patient took pill on 11/23/2019.

EXAM:
OBSTETRIC <14 WK US AND TRANSVAGINAL OB US
TECHNIQUE: Both transabdominal and transvaginal ultrasound examinations were
performed for complete evaluation of the gestation as well as the
maternal uterus, adnexal regions, and pelvic cul-de-sac.
Transvaginal technique was performed to assess early pregnancy.

[Series 1: us ob comp less 14 wks · 99 acquisitions, 13 frames shown]
[im 4/99]
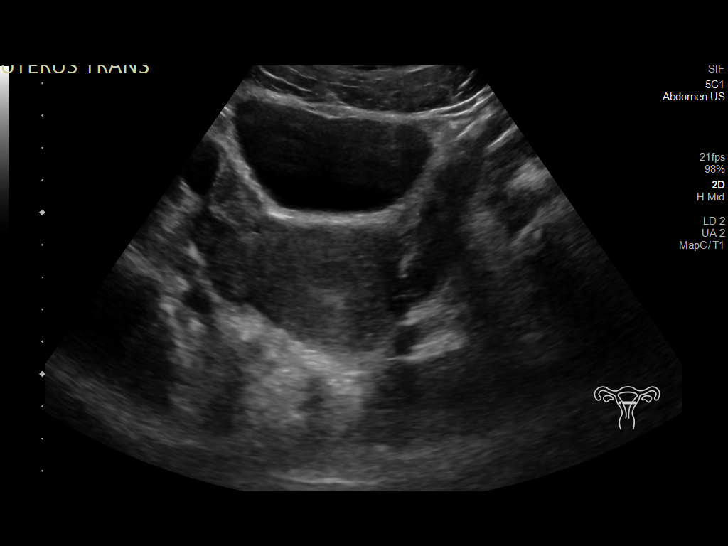
[im 11/99]
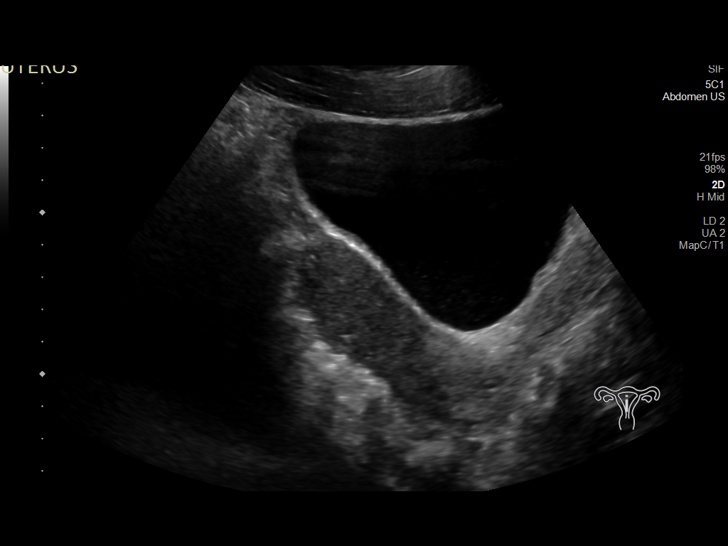
[im 19/99]
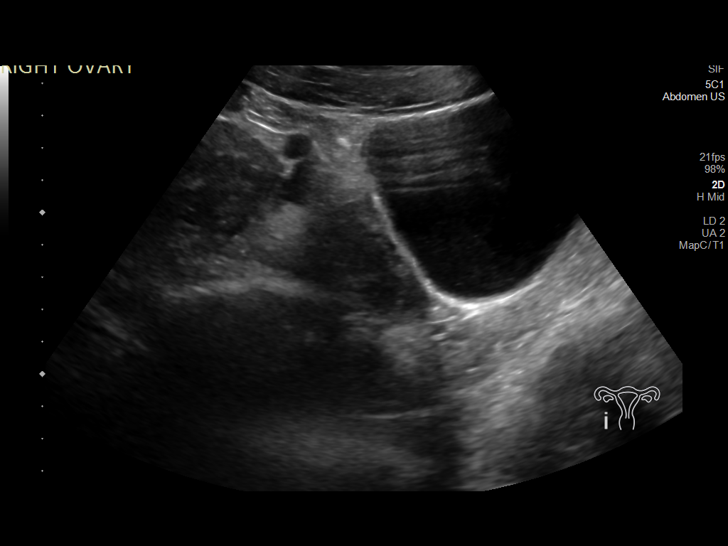
[im 26/99]
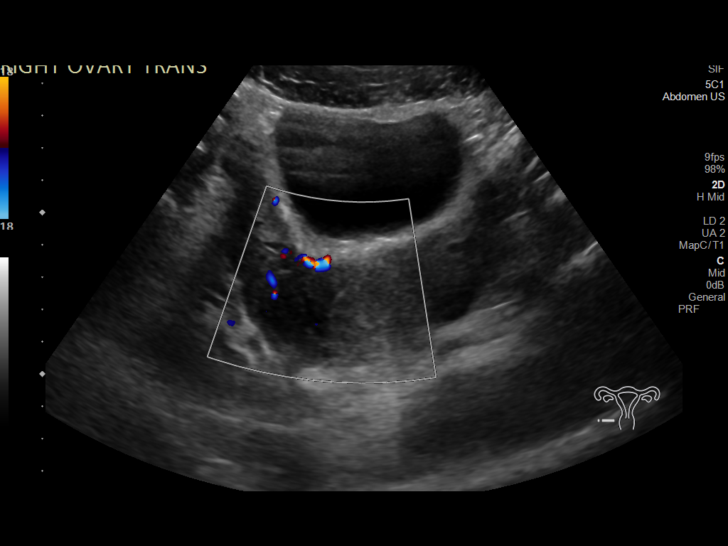
[im 33/99]
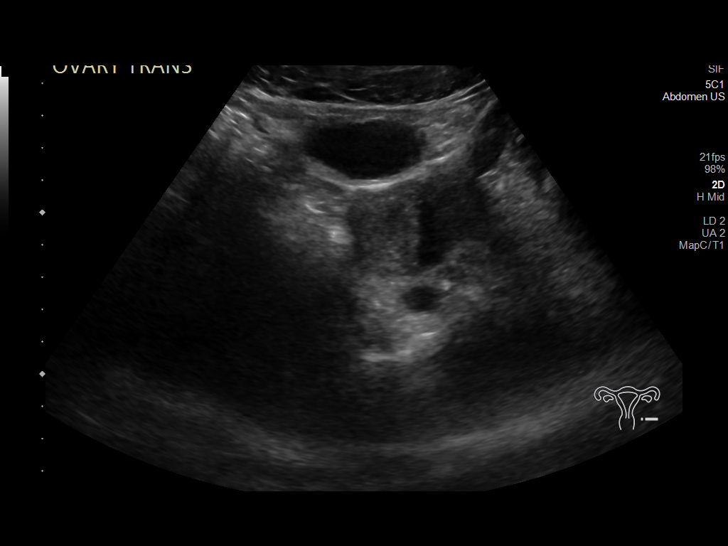
[im 40/99]
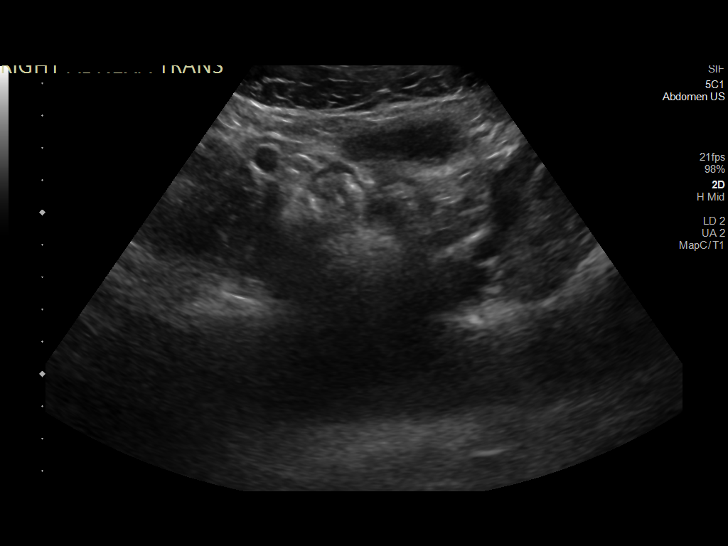
[im 51/99]
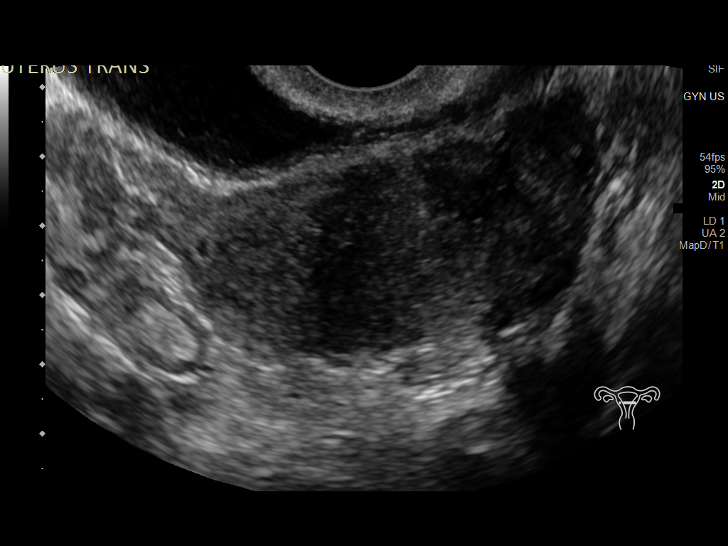
[im 59/99]
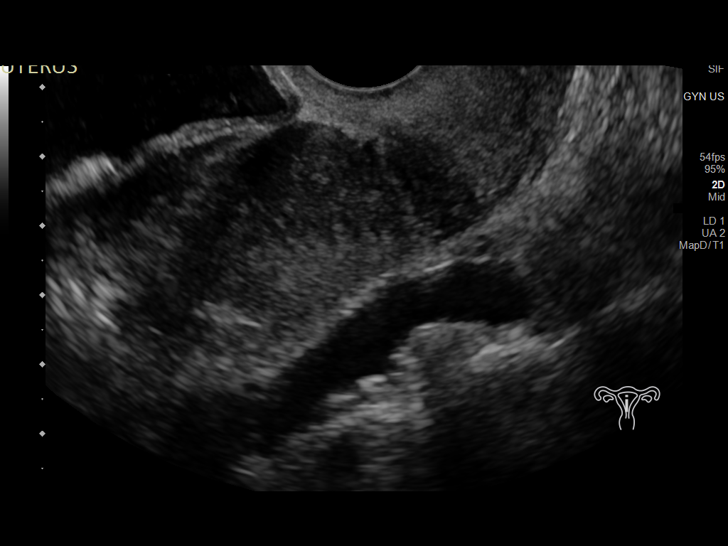
[im 66/99]
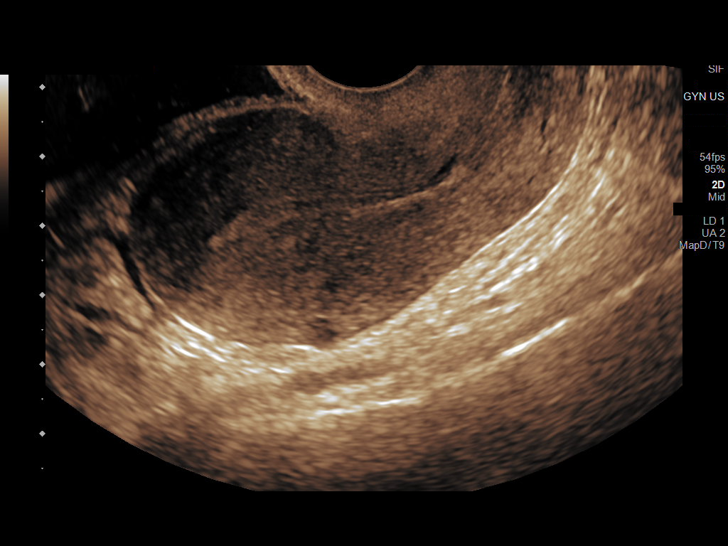
[im 73/99]
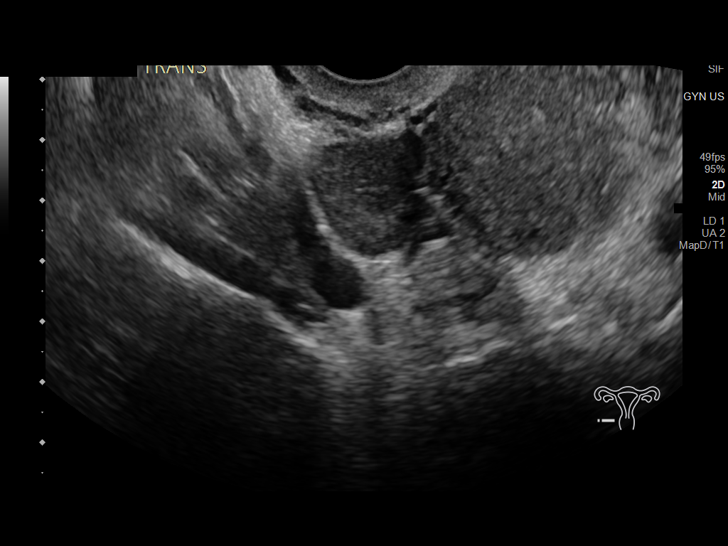
[im 80/99]
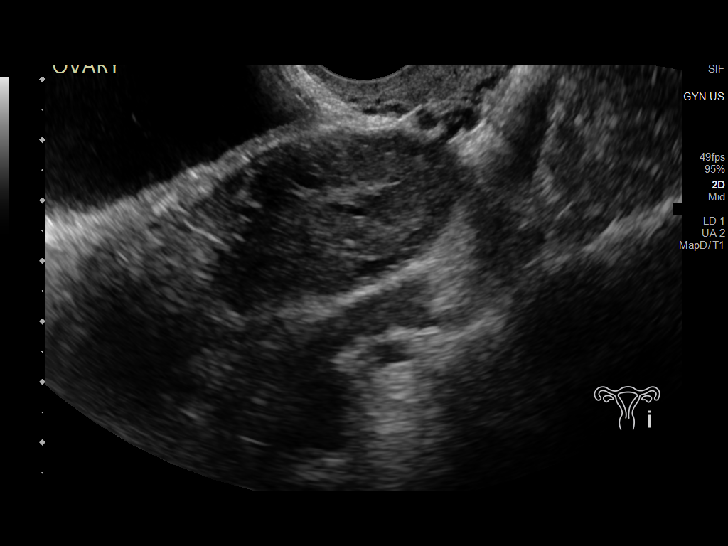
[im 88/99]
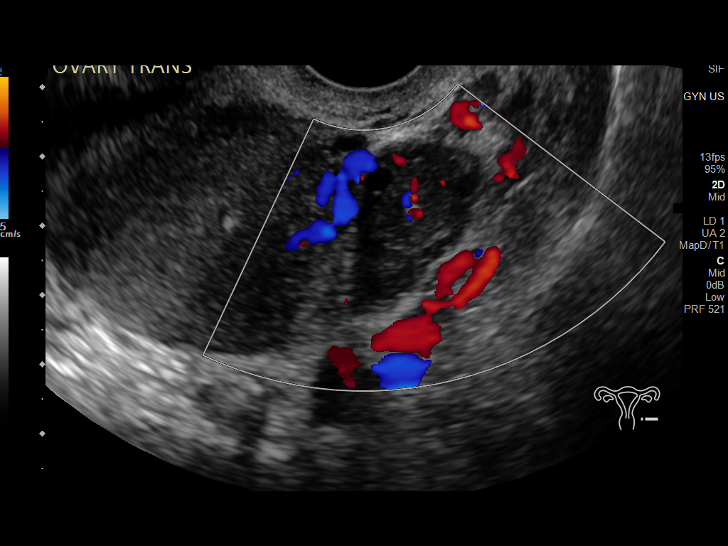
[im 95/99]
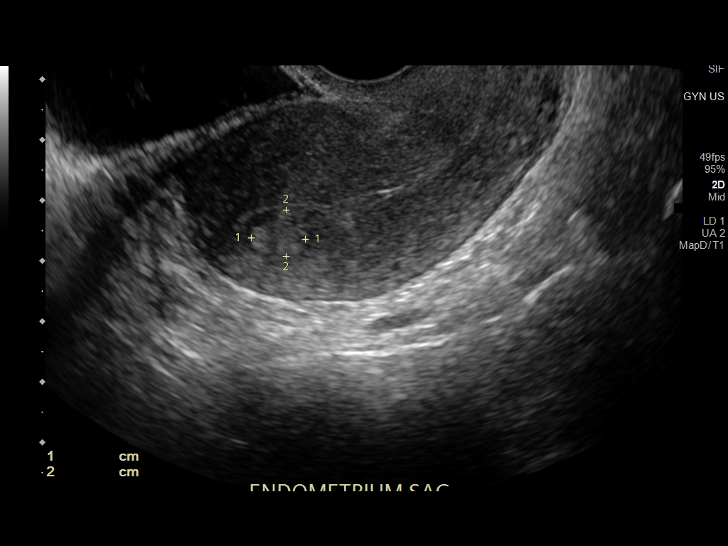

[13 of 28 positions shown; findings below may reference images not displayed]

FINDINGS: The uterus is anteverted appears unremarkable.

The endometrium measures 1 cm in thickness. No intrauterine
pregnancy identified. Findings may represent recent abortion given
the provided history. An early IUP, or an occult ectopic pregnancy
are other less likely considerations.

There is a 9 x 8 mm heterogeneous and echogenic solid content in the
upper endometrium with internal vascularity concerning for retained
product of conception.

The ovaries are unremarkable.

There is trace free fluid in the pelvis.
IMPRESSION: 1. No intrauterine pregnancy identified. Findings represent
pregnancy of unknown location and differential diagnosis includes:
Recent abortion, early IUP, or an occult ectopic pregnancy. However,
findings likely represent recent abortion given the clinical
history.
2. Echogenic content in the upper endometrium is concerning for
retained product of conception. Clinical correlation and follow-up
with serial HCG levels and repeat ultrasound as clinically indicated
recommended.

## 2020-11-25 IMAGING — CR DG CERVICAL SPINE COMPLETE 4+V
5 series · 5 of 5 positions shown · non-contrast
Comparison: None.

CLINICAL DATA: Cervical neck pain.

EXAM:
CERVICAL SPINE - COMPLETE 4+ VIEW

[c-spine lat]
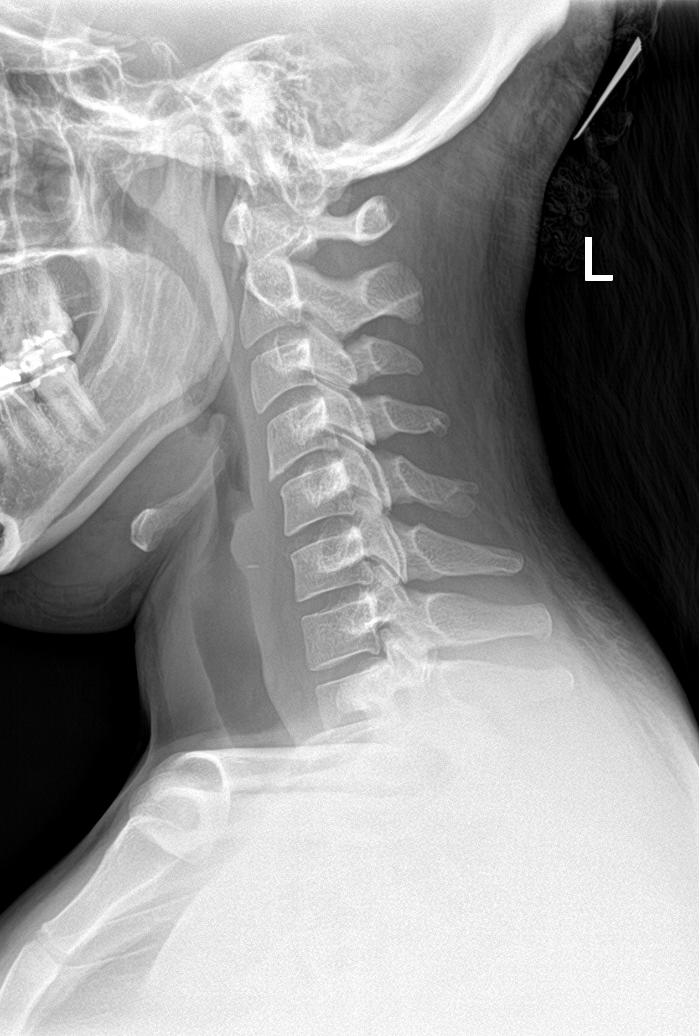

[c-spine obl (1 of 2)]
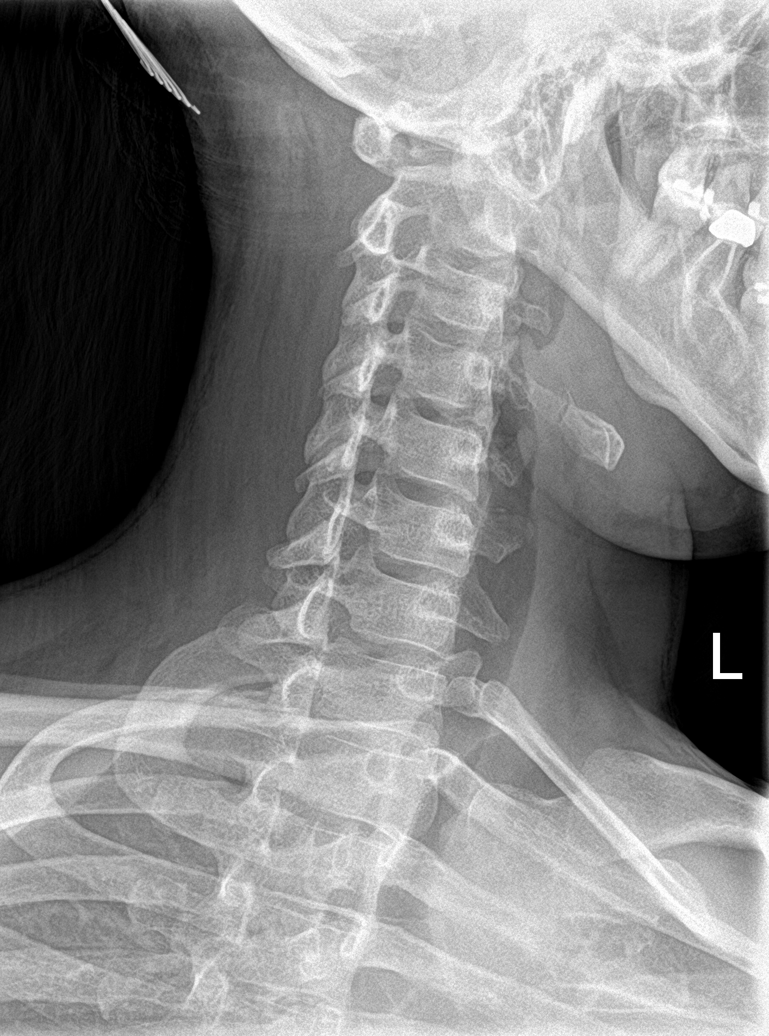

[c-spine obl (2 of 2)]
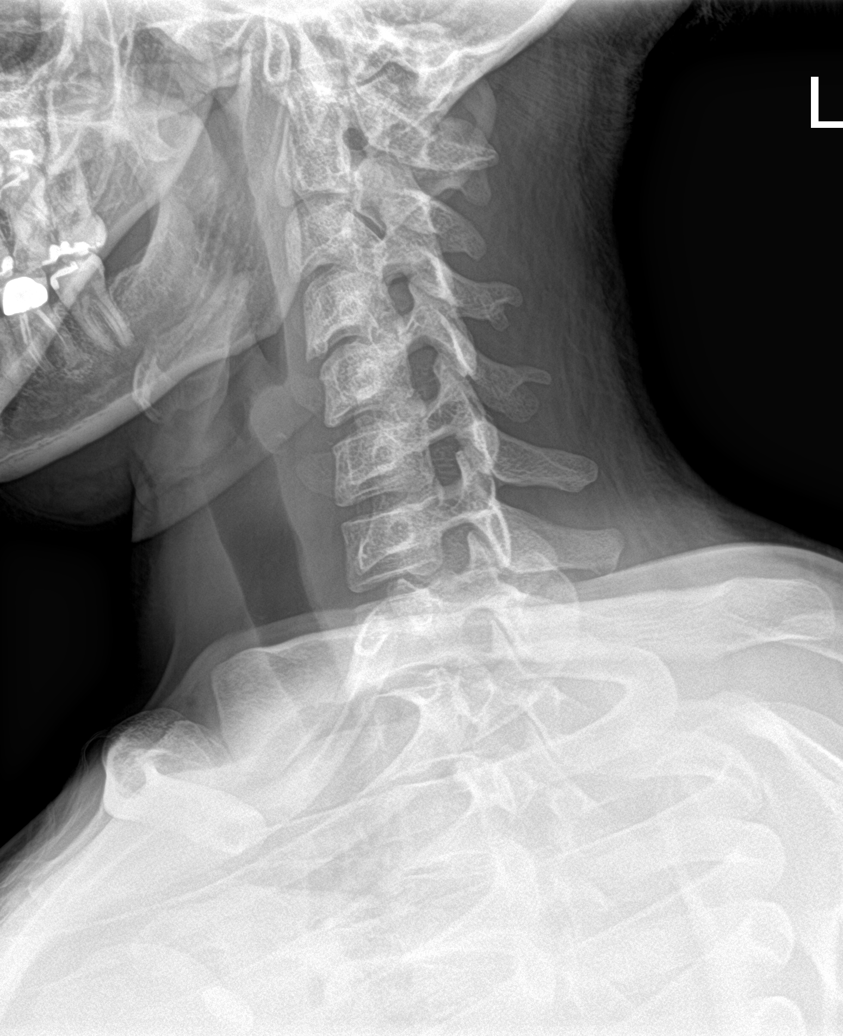

[c-spine ap]
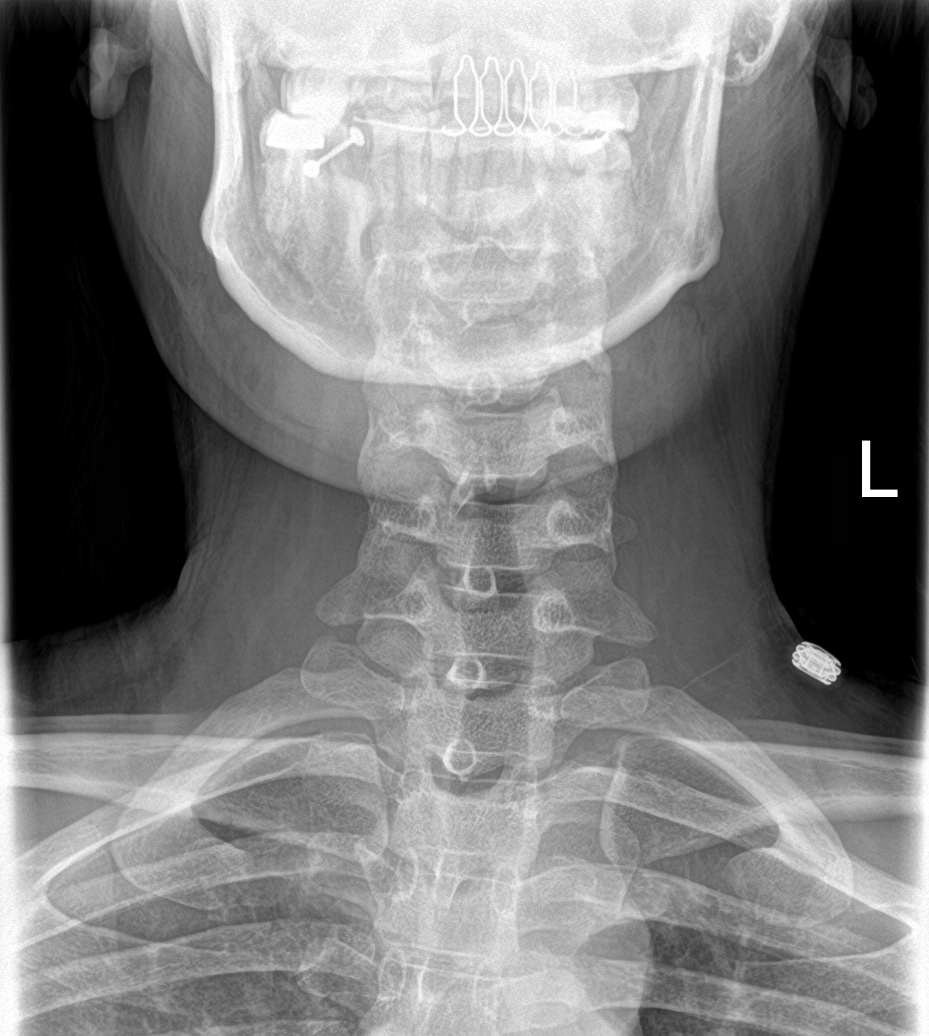

[c-spine open mouth]
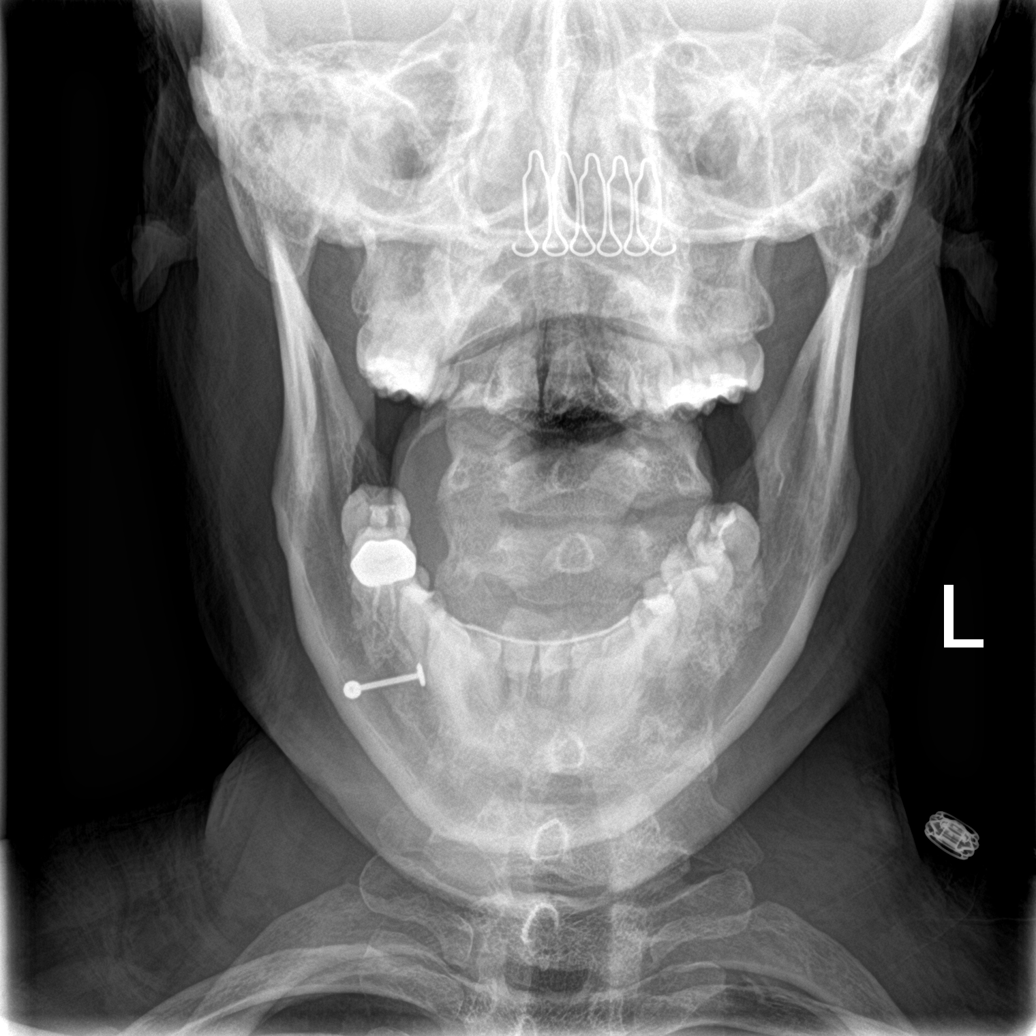

[5 of 5 positions shown; findings below may reference images not displayed]

FINDINGS: Cervical spine alignment is maintained. Vertebral body heights and
intervertebral disc spaces are preserved. The dens is intact. Minor
endplate spurring at C4-C5. Posterior elements appear well-aligned.
There is no evidence of fracture. No prevertebral soft tissue edema.
IMPRESSION: Minor endplate spurring at C4-C5. Otherwise negative cervical spine
radiographs.
